# Patient Record
Sex: Male | Born: 1985 | Race: White | Hispanic: No | Marital: Married | Smoking: Current every day smoker
Health system: Southern US, Community
[De-identification: ages and names within clinical notes are randomized; demographics above are authoritative.]

## PROBLEM LIST (undated history)

## (undated) DIAGNOSIS — J45909 Unspecified asthma, uncomplicated: Secondary | ICD-10-CM

## (undated) DIAGNOSIS — J449 Chronic obstructive pulmonary disease, unspecified: Secondary | ICD-10-CM

---
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
## 2005-03-16 ENCOUNTER — Other Ambulatory Visit: Payer: Self-pay

## 2005-03-16 ENCOUNTER — Emergency Department: Payer: Self-pay | Admitting: Emergency Medicine

## 2005-11-07 ENCOUNTER — Emergency Department: Payer: Self-pay | Admitting: Emergency Medicine

## 2005-11-07 ENCOUNTER — Other Ambulatory Visit: Payer: Self-pay

## 2006-01-18 ENCOUNTER — Emergency Department: Payer: Self-pay | Admitting: Emergency Medicine

## 2006-03-05 ENCOUNTER — Emergency Department: Payer: Self-pay | Admitting: Emergency Medicine

## 2006-04-10 ENCOUNTER — Other Ambulatory Visit: Payer: Self-pay

## 2006-04-11 ENCOUNTER — Inpatient Hospital Stay: Payer: Self-pay | Admitting: Internal Medicine

## 2006-07-28 ENCOUNTER — Emergency Department: Payer: Self-pay | Admitting: Emergency Medicine

## 2006-09-09 ENCOUNTER — Emergency Department: Payer: Self-pay | Admitting: Emergency Medicine

## 2006-09-13 ENCOUNTER — Emergency Department: Payer: Self-pay | Admitting: Emergency Medicine

## 2006-09-30 ENCOUNTER — Emergency Department: Payer: Self-pay | Admitting: Emergency Medicine

## 2007-01-19 ENCOUNTER — Emergency Department: Payer: Self-pay | Admitting: Emergency Medicine

## 2007-01-19 ENCOUNTER — Other Ambulatory Visit: Payer: Self-pay

## 2007-02-08 ENCOUNTER — Emergency Department: Payer: Self-pay | Admitting: Internal Medicine

## 2007-05-23 ENCOUNTER — Emergency Department: Payer: Self-pay | Admitting: Emergency Medicine

## 2007-05-23 ENCOUNTER — Other Ambulatory Visit: Payer: Self-pay

## 2007-07-13 ENCOUNTER — Emergency Department: Payer: Self-pay | Admitting: Emergency Medicine

## 2007-08-13 ENCOUNTER — Emergency Department: Payer: Self-pay | Admitting: Emergency Medicine

## 2007-12-13 ENCOUNTER — Emergency Department: Payer: Self-pay | Admitting: Emergency Medicine

## 2008-06-04 ENCOUNTER — Emergency Department: Payer: Self-pay | Admitting: Emergency Medicine

## 2008-06-06 ENCOUNTER — Emergency Department: Payer: Self-pay | Admitting: Emergency Medicine

## 2008-06-24 ENCOUNTER — Emergency Department: Payer: Self-pay | Admitting: Emergency Medicine

## 2008-07-05 ENCOUNTER — Inpatient Hospital Stay: Payer: Self-pay | Admitting: Internal Medicine

## 2008-07-19 ENCOUNTER — Emergency Department: Payer: Self-pay | Admitting: Emergency Medicine

## 2008-10-26 ENCOUNTER — Emergency Department: Payer: Self-pay | Admitting: Emergency Medicine

## 2009-04-28 ENCOUNTER — Emergency Department: Payer: Self-pay | Admitting: Internal Medicine

## 2009-05-29 ENCOUNTER — Emergency Department: Payer: Self-pay | Admitting: Emergency Medicine

## 2010-02-03 ENCOUNTER — Inpatient Hospital Stay: Payer: Self-pay | Admitting: Psychiatry

## 2010-03-09 ENCOUNTER — Emergency Department: Payer: Self-pay | Admitting: Emergency Medicine

## 2010-11-13 ENCOUNTER — Emergency Department: Payer: Self-pay | Admitting: Emergency Medicine

## 2012-11-09 ENCOUNTER — Emergency Department: Payer: Self-pay | Admitting: Emergency Medicine

## 2013-02-13 LAB — COMPREHENSIVE METABOLIC PANEL
Alkaline Phosphatase: 78 U/L (ref 50–136)
Anion Gap: 7 (ref 7–16)
BUN: 7 mg/dL (ref 7–18)
Bilirubin,Total: 0.3 mg/dL (ref 0.2–1.0)
Calcium, Total: 9 mg/dL (ref 8.5–10.1)
Chloride: 106 mmol/L (ref 98–107)
Creatinine: 0.97 mg/dL (ref 0.60–1.30)
EGFR (Non-African Amer.): >60
Glucose: 124 mg/dL — ABNORMAL HIGH (ref 65–99)
Osmolality: 279 (ref 275–301)
Potassium: 3.2 mmol/L — ABNORMAL LOW (ref 3.5–5.1)
SGOT(AST): 83 U/L — ABNORMAL HIGH (ref 15–37)
SGPT (ALT): 73 U/L (ref 12–78)

## 2013-02-13 LAB — CBC
HCT: 46 % (ref 40.0–52.0)
MCHC: 34.2 g/dL (ref 32.0–36.0)
MCV: 99 fL (ref 80–100)
Platelet: 356 10*3/uL (ref 150–440)
RDW: 12 % (ref 11.5–14.5)

## 2013-02-13 LAB — ETHANOL: Ethanol %: 0.141 % — ABNORMAL HIGH (ref 0.000–0.080)

## 2013-02-13 LAB — SALICYLATE LEVEL: Salicylates, Serum: 2.7 mg/dL

## 2013-02-14 ENCOUNTER — Inpatient Hospital Stay: Payer: Self-pay | Admitting: Surgery

## 2013-02-14 LAB — URINALYSIS, COMPLETE
Bacteria: NONE SEEN
Bilirubin,UR: NEGATIVE
Blood: NEGATIVE
Glucose,UR: NEGATIVE mg/dL (ref 0–75)
Leukocyte Esterase: NEGATIVE
RBC,UR: <1 /HPF (ref 0–5)
Specific Gravity: 1.027 (ref 1.003–1.030)
WBC UR: 1 /HPF (ref 0–5)

## 2013-02-14 LAB — DRUG SCREEN, URINE
Benzodiazepine, Ur Scrn: NEGATIVE (ref ?–200)
MDMA (Ecstasy)Ur Screen: NEGATIVE (ref ?–500)
Opiate, Ur Screen: NEGATIVE (ref ?–300)
Phencyclidine (PCP) Ur S: NEGATIVE (ref ?–25)
Tricyclic, Ur Screen: NEGATIVE (ref ?–1000)

## 2013-03-02 ENCOUNTER — Ambulatory Visit: Payer: Self-pay | Admitting: Surgery

## 2014-02-22 ENCOUNTER — Emergency Department: Payer: Self-pay | Admitting: Emergency Medicine

## 2014-02-22 LAB — TSH: Thyroid Stimulating Horm: 1.47 u[IU]/mL

## 2014-02-22 LAB — CBC WITH DIFFERENTIAL/PLATELET
Basophil #: 0 10*3/uL (ref 0.0–0.1)
Basophil %: 0.3 %
EOS PCT: 1 %
Eosinophil #: 0.1 10*3/uL (ref 0.0–0.7)
HCT: 48.2 % (ref 40.0–52.0)
HGB: 16.2 g/dL (ref 13.0–18.0)
Lymphocyte #: 2.5 10*3/uL (ref 1.0–3.6)
Lymphocyte %: 28.2 %
MCH: 34.4 pg — AB (ref 26.0–34.0)
MCHC: 33.6 g/dL (ref 32.0–36.0)
MCV: 102 fL — AB (ref 80–100)
MONO ABS: 0.5 x10 3/mm (ref 0.2–1.0)
MONOS PCT: 6 %
Neutrophil #: 5.6 10*3/uL (ref 1.4–6.5)
Neutrophil %: 64.5 %
Platelet: 265 10*3/uL (ref 150–440)
RBC: 4.72 10*6/uL (ref 4.40–5.90)
RDW: 12.7 % (ref 11.5–14.5)
WBC: 8.7 10*3/uL (ref 3.8–10.6)

## 2014-02-22 LAB — COMPREHENSIVE METABOLIC PANEL
ALBUMIN: 3.9 g/dL (ref 3.4–5.0)
Alkaline Phosphatase: 63 U/L
Anion Gap: 9 (ref 7–16)
BILIRUBIN TOTAL: 0.7 mg/dL (ref 0.2–1.0)
BUN: 8 mg/dL (ref 7–18)
CHLORIDE: 105 mmol/L (ref 98–107)
CO2: 27 mmol/L (ref 21–32)
Calcium, Total: 8.7 mg/dL (ref 8.5–10.1)
Creatinine: 0.9 mg/dL (ref 0.60–1.30)
EGFR (African American): >60
EGFR (Non-African Amer.): >60
GLUCOSE: 91 mg/dL (ref 65–99)
Osmolality: 279 (ref 275–301)
Potassium: 3.2 mmol/L — ABNORMAL LOW (ref 3.5–5.1)
SGOT(AST): 15 U/L (ref 15–37)
SGPT (ALT): 22 U/L (ref 12–78)
SODIUM: 141 mmol/L (ref 136–145)
Total Protein: 7 g/dL (ref 6.4–8.2)

## 2014-02-22 LAB — URINALYSIS, COMPLETE
BACTERIA: NONE SEEN
BLOOD: NEGATIVE
Bilirubin,UR: NEGATIVE
Glucose,UR: NEGATIVE mg/dL (ref 0–75)
KETONE: NEGATIVE
Leukocyte Esterase: NEGATIVE
Nitrite: NEGATIVE
Ph: 5 (ref 4.5–8.0)
Protein: NEGATIVE
RBC,UR: NONE SEEN /HPF (ref 0–5)
SPECIFIC GRAVITY: 1.03 (ref 1.003–1.030)
Squamous Epithelial: NONE SEEN
WBC UR: 3 /HPF (ref 0–5)
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
## 2014-12-07 NOTE — Consult Note (Signed)
Brief Consult Note: Diagnosis: multiple right rib fractures with small pneumothorax.   Patient was seen by consultant.   Consult note dictated.   Discussed with Attending MD.   Comments: Mulitple right sided rib fractures with small pneumothorax on CT scan.  Fractures are high and therefore an epidural may not be possible to achieve adequate pain control.  Would recommend oral narcotics, pulmonary toilet with ambulation and incentive spirometry.  I would be happy to follow as an outpatient.  Electronic Signatures: Jasmine Decemberaks, Sharrell Krawiec E (MD)  (Signed 01-Jul-14 19:02)  Authored: Brief Consult Note   Last Updated: 01-Jul-14 19:02 by Jasmine Decemberaks, Anicka Stuckert E (MD)
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
## 2015-03-08 ENCOUNTER — Emergency Department
Admission: EM | Admit: 2015-03-08 | Discharge: 2015-03-08 | Disposition: A | Discharge status: Home / Self Care | Payer: Self-pay | Attending: Emergency Medicine | Admitting: Emergency Medicine

## 2015-03-08 ENCOUNTER — Other Ambulatory Visit: Payer: Self-pay

## 2015-03-08 ENCOUNTER — Emergency Department: Payer: Self-pay

## 2015-03-08 DIAGNOSIS — W92XXXA Exposure to excessive heat of man-made origin, initial encounter: Secondary | ICD-10-CM | POA: Insufficient documentation

## 2015-03-08 DIAGNOSIS — Y9389 Activity, other specified: Secondary | ICD-10-CM | POA: Insufficient documentation

## 2015-03-08 DIAGNOSIS — R079 Chest pain, unspecified: Secondary | ICD-10-CM | POA: Insufficient documentation

## 2015-03-08 DIAGNOSIS — Y990 Civilian activity done for income or pay: Secondary | ICD-10-CM | POA: Insufficient documentation

## 2015-03-08 DIAGNOSIS — T675XXA Heat exhaustion, unspecified, initial encounter: Secondary | ICD-10-CM | POA: Insufficient documentation

## 2015-03-08 DIAGNOSIS — Z720 Tobacco use: Secondary | ICD-10-CM | POA: Insufficient documentation

## 2015-03-08 HISTORY — DX: Unspecified asthma, uncomplicated: J45.909

## 2015-03-08 HISTORY — DX: Chronic obstructive pulmonary disease, unspecified: J44.9

## 2015-03-08 LAB — COMPREHENSIVE METABOLIC PANEL
ALT: 32 U/L (ref 17–63)
AST: 26 U/L (ref 15–41)
Albumin: 4.6 g/dL (ref 3.5–5.0)
Alkaline Phosphatase: 64 U/L (ref 38–126)
Anion gap: 6 (ref 5–15)
BILIRUBIN TOTAL: 0.8 mg/dL (ref 0.3–1.2)
BUN: 12 mg/dL (ref 6–20)
CALCIUM: 9.2 mg/dL (ref 8.9–10.3)
CO2: 27 mmol/L (ref 22–32)
CREATININE: 0.83 mg/dL (ref 0.61–1.24)
Chloride: 107 mmol/L (ref 101–111)
GFR calc Af Amer: >60 mL/min (ref >60)
GFR calc non Af Amer: >60 mL/min (ref >60)
GLUCOSE: 84 mg/dL (ref 65–99)
Potassium: 3.6 mmol/L (ref 3.5–5.1)
SODIUM: 140 mmol/L (ref 135–145)
Total Protein: 7.4 g/dL (ref 6.5–8.1)

## 2015-03-08 LAB — CBC
HCT: 46.7 % (ref 40.0–52.0)
Hemoglobin: 16.3 g/dL (ref 13.0–18.0)
MCH: 33.5 pg (ref 26.0–34.0)
MCHC: 34.8 g/dL (ref 32.0–36.0)
MCV: 96.1 fL (ref 80.0–100.0)
Platelets: 330 10*3/uL (ref 150–440)
RBC: 4.86 MIL/uL (ref 4.40–5.90)
RDW: 11.6 % (ref 11.5–14.5)
WBC: 8.8 10*3/uL (ref 3.8–10.6)

## 2015-03-08 LAB — TROPONIN I

## 2015-03-08 NOTE — Discharge Instructions (Signed)

## 2015-03-08 NOTE — ED Provider Notes (Signed)
[facility] Emergency Department Provider Note  ____________________________________________  Time seen: Approximately  PM  I have reviewed the triage vital signs and the nursing notes.   HISTORY  Chief Complaint Dizziness    HPI Eric [name] is a 29 y.o. male with a history of COPD who presents today with feeling faint at work today. 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 He said that he got sweaty and nauseous and vomited several times at work. He then was able to drink water and Gatorade and is feeling better at this time. He has since been able to handle it without feeling diaphoretic or like he is going to pass out. He is also complaining of chest pain that is central left sided and has been going on for months. He says however that it has worsened over the past 3 days. He says it has been constant over the past 3 days without any exacerbating factors.   Past Medical History  Diagnosis Date  . Asthma   . COPD (chronic obstructive pulmonary disease)     There are no active problems to display for this patient.   History reviewed. No pertinent past surgical history.  No current outpatient prescriptions on file.  Allergies Review of patient's allergies indicates no known allergies.  History reviewed. No pertinent family history.  Social History History  Substance Use Topics  . Smoking status: Current Every Day Smoker -- 0.50 packs/day    Types: Cigarettes  . Smokeless tobacco: Never Used  . Alcohol Use: Yes    Review of Systems Constitutional: No fever/chills Eyes: No visual changes. ENT: No sore throat. Cardiovascular: As above Respiratory: Denies shortness of breath. Gastrointestinal: No abdominal pain.  No nausea, no vomiting.  No diarrhea.  No constipation. Genitourinary: Negative for dysuria. Musculoskeletal: Negative for back pain. Skin: Negative for rash. Neurological: Negative for headaches, focal weakness or  numbness.  10-point ROS otherwise negative.  ____________________________________________   PHYSICAL EXAM:  VITAL SIGNS: ED Triage Vitals  Enc Vitals Group     BP 03/08/15 1818 129/83 mmHg     Pulse Rate 03/08/15 1818 77     Resp 03/08/15 1818 16     Temp 03/08/15 1818 98.2 F (36.8 C)     Temp Source 03/08/15 1818 Oral     SpO2 03/08/15 1818 98 %     Weight 03/08/15 1818 160 lb (72.576 kg)     Height 03/08/15 1818 5\' 6"  (1.676 m)     Head Cir --      Peak Flow --      Pain Score 03/08/15 1823 6     Pain Loc --      Pain Edu? --      Excl. in GC? --     Constitutional: Alert and oriented. Well appearing and in no acute distress. Eyes: Conjunctivae are normal. PERRL. EOMI. Head: Atraumatic. Nose: No congestion/rhinnorhea. Mouth/Throat: Mucous membranes are moist.  Oropharynx non-erythematous. Neck: No stridor.   Cardiovascular: Normal rate, regular rhythm. Grossly normal heart sounds.  Good peripheral circulation. Pain is reproducible palpation to the left chest. Respiratory: Normal respiratory effort.  No retractions. Lungs CTAB. Gastrointestinal: Soft and nontender. No distention. No abdominal bruits. No CVA tenderness. Musculoskeletal: No lower extremity tenderness nor edema.  No joint effusions. Neurologic:  Normal speech and language. No gross focal neurologic deficits are appreciated. No gait instability. Skin:  Skin is warm, dry and intact. No rash noted. Psychiatric: Mood and  affect are normal. Speech and behavior are normal.  ____________________________________________   LABS (all labs ordered are listed, but only abnormal results are displayed)  Labs Reviewed  COMPREHENSIVE METABOLIC PANEL  CBC  TROPONIN I   ____________________________________________  EKG  ED ECG REPORT I, Gibson Lad,  Teena Irani, the attending physician, personally viewed and interpreted this ECG.   Date: 03/08/2015  EKG Time: 1814  Rate: 80  Rhythm: normal sinus rhythm  Axis:  Normal axis  Intervals:none  ST&T Change: No ST elevation or depressions. No abnormal T-wave inversions.  ____________________________________________  RADIOLOGY  Borderline cardiomegaly. I personally reviewed these films. ____________________________________________   PROCEDURES    ____________________________________________   INITIAL IMPRESSION / ASSESSMENT AND PLAN / ED COURSE  Pertinent labs & imaging results that were available during my care of the patient were reviewed by me and considered in my medical decision making (see chart for details).  ----------------------------------------- 11:29 PM on 03/08/2015 -----------------------------------------  Discussed the x-ray results with the patient and the need to follow-up at a primary care doctor for likely echocardiogram. Patient does not do any exertional activities or engage in any vigorous athletic activities. Advised against any thing like this until he is cleared by his primary care doctor. Also counseled to stay hydrated with drinking plenty of water. Patient understands the plan and is willing to comply. PERC negative. ____________________________________________   FINAL CLINICAL IMPRESSION(S) / ED DIAGNOSES  Acute chest pain. Initial visit. Acute heat exhaustion.    Myrna Blazer, MD 03/08/15 [phone]

## 2015-03-08 NOTE — ED Notes (Signed)
Patient comes in via private vehicle.  Patient reports that he was at work and got hot all over and felt like he was going to "pass out".

## 2015-03-08 NOTE — ED Notes (Signed)
Patient was at work. Got too hot , felt dehydrated, got pale and nauseated. Wanted to go home but work stated he would need a work note.

## 2015-03-11 ENCOUNTER — Encounter: Payer: Self-pay | Admitting: Emergency Medicine

## 2015-03-11 ENCOUNTER — Emergency Department
Admission: EM | Admit: 2015-03-11 | Discharge: 2015-03-11 | Disposition: A | Discharge status: Home / Self Care | Payer: Self-pay | Attending: Emergency Medicine | Admitting: Emergency Medicine

## 2015-03-11 DIAGNOSIS — Z720 Tobacco use: Secondary | ICD-10-CM | POA: Insufficient documentation

## 2015-03-11 DIAGNOSIS — H1131 Conjunctival hemorrhage, right eye: Secondary | ICD-10-CM | POA: Insufficient documentation

## 2015-03-11 NOTE — Discharge Instructions (Signed)
Subconjunctival Hemorrhage °A subconjunctival hemorrhage is a bright red patch covering a portion of the white of the eye. The white part of the eye is called the sclera, and it is covered by a thin membrane called the conjunctiva. This membrane is clear, except for tiny blood vessels that you can see with the naked eye. When your eye is irritated or inflamed and becomes red, it is because the vessels in the conjunctiva are swollen. °Sometimes, a blood vessel in the conjunctiva can break and bleed. When this occurs, the blood builds up between the conjunctiva and the sclera, and spreads out to create a red area. The red spot may be very small at first. It may then spread to cover a larger part of the surface of the eye, or even all of the visible white part of the eye. °In almost all cases, the blood will go away and the eye will become white again. Before completely dissolving, however, the red area may spread. It may also become brownish-yellow in color before going away. If a lot of blood collects under the conjunctiva, it may look like a bulge on the surface of the eye. This looks scary, but it will also eventually flatten out and go away. Subconjunctival hemorrhages do not cause pain, but if swollen, may cause a feeling of irritation. There is no effect on vision.  °CAUSES  °· The most common cause is mild trauma (rubbing the eye, irritation). °· Subconjunctival hemorrhages can happen because of coughing or straining (lifting heavy objects), vomiting, or sneezing. °· In some cases, your doctor may want to check your blood pressure. High blood pressure can also cause a subconjunctival hemorrhage. °· Severe trauma or blunt injuries. °· Diseases that affect blood clotting (hemophilia, leukemia). °· Abnormalities of blood vessels behind the eye (carotid cavernous sinus fistula). °· Tumors behind the eye. °· Certain drugs (aspirin, Coumadin, heparin). °· Recent eye surgery. °HOME CARE INSTRUCTIONS  °· Do not worry  about the appearance of your eye. You may continue your usual activities. °· Often, follow-up is not necessary. °SEEK MEDICAL CARE IF:  °· Your eye becomes painful. °· The bleeding does not disappear within 3 weeks. °· Bleeding occurs elsewhere, for example, under the skin, in the mouth, or in the other eye. °· You have recurring subconjunctival hemorrhages. °SEEK IMMEDIATE MEDICAL CARE IF:  °· Your vision changes or you have difficulty seeing. °· You develop a severe headache, persistent vomiting, confusion, or abnormal drowsiness (lethargy). °· Your eye seems to bulge or protrude from the eye socket. °· You notice the sudden appearance of bruises or have spontaneous bleeding elsewhere on your body. °Document Released: 08/03/2005 Document Revised: 12/18/2013 Document Reviewed: 07/01/2009 °Exit[employer]® Patient Information ©2015 [employer]. This information is not intended to replace advice given to you by your health care provider. Make sure you discuss any questions you have with your health care provider. ° °

## 2015-03-11 NOTE — ED Notes (Signed)
Pt states he woke up this am and noticed redness to right eye.  No vision change, no pain.

## 2015-03-11 NOTE — ED Provider Notes (Signed)
[facility] Emergency Department Provider Note  ____________________________________________  Time seen: Approximately 2:32 PM  I have reviewed the triage vital signs and the nursing notes.   HISTORY  Chief Complaint Eye Problem    HPI Eric [name] is a 29 y.o. male patient awakened this morning nose or redness to the lateral aspect of the right. Patient denies any vision loss. He stated there is no pain with this complaint. She denies any history of trauma although he stated he has a forceful coughing spell 2 days ago. Denies any URI symptoms  at this time.Patient stated no palliative measures taken for this complaint.   Past Medical History  Diagnosis Date  . Asthma   . COPD (chronic obstructive pulmonary disease)     There are no active problems to display for this patient.   History reviewed. No pertinent past surgical history.  No current outpatient prescriptions on file.  Allergies Review of patient's allergies indicates no known allergies.  History reviewed. No pertinent family history.  Social History History  Substance Use Topics  . Smoking status: Current Every Day Smoker -- 0.50 packs/day    Types: Cigarettes  . Smokeless tobacco: Never Used  . Alcohol Use: Yes    Review of Systems Constitutional: No fever/chills Eyes: No visual changes. ENT: No sore throat. Red spot lateral aspect of right eye. Cardiovascular: Denies chest pain. Respiratory: Denies shortness of breath. Gastrointestinal: No abdominal pain.  No nausea, no vomiting.  No diarrhea.  No constipation. Genitourinary: Negative for dysuria. Musculoskeletal: Negative for back pain. Skin: Negative for rash. Neurological: Negative for headaches, focal weakness or numbness. A10-point ROS otherwise negative.  ____________________________________________   PHYSICAL EXAM:  VITAL SIGNS: ED Triage Vitals  Enc Vitals Group     BP 03/11/15 1356 135/90 mmHg     Pulse  Rate 03/11/15 1356 83     Resp 03/11/15 1356 18     Temp 03/11/15 1356 98.4 F (36.9 C)     Temp Source 03/11/15 1356 Oral     SpO2 03/11/15 1356 100 %     Weight 03/11/15 1356 160 lb (72.576 kg)     Height 03/11/15 1356 5\' 6"  (1.676 m)     Head Cir --      Peak Flow --      Pain Score 03/11/15 1357 0     Pain Loc --      Pain Edu? --      Excl. in GC? --    Constitutional: Alert and oriented. Well appearing and in no acute distress. Eyes: Conjunctivae are normal. PERRL. EOMI. small area of soft conjunctiva hemorrhaging  lateral aspect right. Head: Atraumatic. Nose: No congestion/rhinnorhea. Mouth/Throat: Mucous membranes are moist.  Oropharynx non-erythematous. Neck: No stridor. No cervical spine tenderness to palpation. Hematological/Lymphatic/Immunilogical: No cervical lymphadenopathy. Cardiovascular: Normal rate, regular rhythm. Grossly normal heart sounds.  Good peripheral circulation. Respiratory: Normal respiratory effort.  No retractions. Lungs CTAB. Gastrointestinal: Soft and nontender. No distention. No abdominal bruits. No CVA tenderness. Musculoskeletal: No lower extremity tenderness nor edema.  No joint effusions. Neurologic:  Normal speech and language. No gross focal neurologic deficits are appreciated. No gait instability. Skin:  Skin is warm, dry and intact. No rash noted. Psychiatric: Mood and affect are normal. Speech and behavior are normal.  ____________________________________________   LABS (all labs ordered are listed, but only abnormal results are displayed)  Labs Reviewed - No data to display ____________________________________________  EKG   ____________________________________________  RADIOLOGY  ____________________________________________   PROCEDURES  Procedure(s) performed: None  Critical Care performed: No  ____________________________________________   INITIAL IMPRESSION / ASSESSMENT AND PLAN / ED COURSE  Pertinent labs &  imaging results that were available during my care of the patient were reviewed by me and considered in my medical decision making (see chart for details).  Social conjunctiva hemorrhaging right eye. Patient reassurance given. Patient advised to follow with his family doctor or return by ER if condition worsens. 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 ____________________________________________   FINAL CLINICAL IMPRESSION(S) / ED DIAGNOSES  Final diagnoses:  Subconjunctival bleed, right      Joni Reining, PA-C 03/11/15 1438  Jene Every, MD 03/11/15 1530

## 2015-08-19 ENCOUNTER — Emergency Department
Admission: EM | Admit: 2015-08-19 | Discharge: 2015-08-19 | Disposition: A | Discharge status: Home / Self Care | Payer: Self-pay | Attending: Emergency Medicine | Admitting: Emergency Medicine

## 2015-08-19 ENCOUNTER — Encounter: Payer: Self-pay | Admitting: Emergency Medicine

## 2015-08-19 DIAGNOSIS — K047 Periapical abscess without sinus: Secondary | ICD-10-CM | POA: Insufficient documentation

## 2015-08-19 DIAGNOSIS — K032 Erosion of teeth: Secondary | ICD-10-CM | POA: Insufficient documentation

## 2015-08-19 DIAGNOSIS — F17210 Nicotine dependence, cigarettes, uncomplicated: Secondary | ICD-10-CM | POA: Insufficient documentation

## 2015-08-19 MED ORDER — MAGIC MOUTHWASH W/LIDOCAINE: 5.0000 mL | Freq: Four times a day (QID) | ORAL | Status: DC

## 2015-08-19 MED ORDER — AMOXICILLIN 875 MG PO TABS: 875.0000 mg | ORAL_TABLET | Freq: Two times a day (BID) | ORAL | Status: DC

## 2015-08-19 NOTE — ED Provider Notes (Signed)
[facility] Emergency Department Provider Note ?  ? ____________________________________________ ? Time seen: 10:01 PM ? I have reviewed the triage vital signs and the nursing notes.  ________ HISTORY ? Chief Complaint Dental Pain     HPI  Eric [name] is a 30 y.o. male   endorses left-sided lower jaw dental pain. He states he has known poor dentition but does not have a dentist. He states that symptoms began 2 days prior and had increased over the intervening period. He denies any fevers or chills, difficulty breathing or swallowing. ? ? ? Past Medical History  Diagnosis Date  . Asthma   . COPD (chronic obstructive pulmonary disease) (HCC)     There are no active problems to display for this patient.  ? History reviewed. No pertinent past surgical history. ? Current Outpatient Rx  Name  Route  Sig  Dispense  Refill  . amoxicillin (AMOXIL) 875 MG tablet   Oral   Take 1 tablet (875 mg total) by mouth 2 (two) times daily.   14 tablet   0   . magic mouthwash w/lidocaine SOLN   Oral   Take 5 mLs by mouth 4 (four) times daily.   240 mL   0     Dispense in a 1/1/1/1 ratio. Use lidocaine, diphen ...    ? Allergies Review of patient's allergies indicates no known allergies. ? No family history on file. ? Social History Social History  Substance Use Topics  . Smoking status: Current Every Day Smoker -- 0.50 packs/day    Types: Cigarettes  . Smokeless tobacco: Never Used  . Alcohol Use: Yes   ? Review of Systems Constitutional: no fever. Eyes: no discharge ENT: no sore throat. Endorses left lower dental pain. Cardiovascular: no chest pain. Respiratory: no cough. No sob Gastrointestinal: denies abdominal pain, vomiting, diarrhea, and constipation Genitourinary: no dysuria. Negative for hematuria Musculoskeletal: Negative for back pain. Skin: Negative for rash. Neurological: Negative for headaches  10-point ROS otherwise  negative.  _______________ PHYSICAL EXAM: ? VITAL SIGNS:   ED Triage Vitals  Enc Vitals Group     BP 08/19/15 2019 135/78 mmHg     Pulse Rate 08/19/15 2019 82     Resp 08/19/15 2019 18     Temp 08/19/15 2019 98.5 F (36.9 C)     Temp Source 08/19/15 2019 Oral     SpO2 08/19/15 2019 96 %     Weight 08/19/15 2019 170 lb (77.111 kg)     Height 08/19/15 2019 5\' 5"  (1.651 m)     Head Cir --      Peak Flow --      Pain Score 08/19/15 2040 10     Pain Loc --      Pain Edu? --      Excl. in GC? --    ?  Constitutional: Alert and oriented. Well appearing and in no distress. Eyes: Conjunctivae are normal.  ENT      Head: Normocephalic and atraumatic.      Ears:       Nose: No congestion/rhinnorhea.      Mouth/Throat: Mucous membranes are moist.  poor dentition throughout mouth. Significant erosion to second molar left lower dentition. Surrounding erythema and edema. Hematological/Lymphatic/Immunilogical: No cervical lymphadenopathy. Cardiovascular: Normal rate, regular rhythm. Normal S1 and S2. Respiratory: Normal respiratory effort without tachypnea nor retractions. Lungs CTAB. Gastrointestinal: Soft and nontender. No distention. There is no CVA tenderness. Genitourinary:  Musculoskeletal: Nontender with normal range  of motion in all extremities.  Neurologic:  Normal speech and language. No gross focal neurologic deficits are appreciated. Skin:  Skin is warm, dry and intact. No rash noted. Psychiatric: Mood and affect are normal. Speech and behavior are normal. Patient exhibits appropriate insight and judgment.    LABS (all labs ordered are listed, but only abnormal results are displayed)  Labs Reviewed - No data to display  ___________ RADIOLOGY    _____________ PROCEDURES ? Procedure(s) performed:    Medications - No data to display  ______________________________________________________ INITIAL IMPRESSION / ASSESSMENT AND PLAN / ED COURSE ? Pertinent labs &  imaging results that were available during my care of the patient were reviewed by me and considered in my medical decision making (see chart for details).   Patient's diagnosis is consistent with dental caries and dental abscess. Patient will be placed on antibiotics and magic mouthwash for symptom control. He may take Tylenol and ibuprofen in addition for additional symptom control. Patient will follow-up with [facility] dental clinic.     New Prescriptions   AMOXICILLIN (AMOXIL) 875 MG TABLET    Take 1 tablet (875 mg total) by mouth 2 (two) times daily.   MAGIC MOUTHWASH W/LIDOCAINE SOLN    Take 5 mLs by mouth 4 (four) times daily.   ____________________________________________ FINAL CLINICAL IMPRESSION(S) / ED DIAGNOSES?  Final diagnoses:  Dental abscess            Racheal Patches, PA-C 08/19/15 2201  Jennye Moccasin, MD 08/19/15 2328

## 2015-08-19 NOTE — Discharge Instructions (Signed)
Dental Abscess °A dental abscess is a collection of pus in or around a tooth. °CAUSES °This condition is caused by a bacterial infection around the root of the tooth that involves the inner part of the tooth (pulp). It may result from: °· Severe tooth decay. °· Trauma to the tooth that allows bacteria to enter into the pulp, such as a broken or chipped tooth. °· Severe gum disease around a tooth. °SYMPTOMS °Symptoms of this condition include: °· Severe pain in and around the infected tooth. °· Swelling and redness around the infected tooth, in the mouth, or in the face. °· Tenderness. °· Pus drainage. °· Bad breath. °· Bitter taste in the mouth. °· Difficulty swallowing. °· Difficulty opening the mouth. °· Nausea. °· Vomiting. °· Chills. °· Swollen neck glands. °· Fever. °DIAGNOSIS °This condition is diagnosed with examination of the infected tooth. During the exam, your dentist may tap on the infected tooth. Your dentist will also ask about your medical and dental history and may order X-rays. °TREATMENT °This condition is treated by eliminating the infection. This may be done with: °· Antibiotic medicine. °· A root canal. This may be performed to save the tooth. °· Pulling (extracting) the tooth. This may also involve draining the abscess. This is done if the tooth cannot be saved. °HOME CARE INSTRUCTIONS °· Take medicines only as directed by your dentist. °· If you were prescribed antibiotic medicine, finish all of it even if you start to feel better. °· Rinse your mouth (gargle) often with salt water to relieve pain or swelling. °· Do not drive or operate heavy machinery while taking pain medicine. °· Do not apply heat to the outside of your mouth. °· Keep all follow-up visits as directed by your dentist. This is important. °SEEK MEDICAL CARE IF: °· Your pain is worse and is not helped by medicine. °SEEK IMMEDIATE MEDICAL CARE IF: °· You have a fever or chills. °· Your symptoms suddenly get worse. °· You have a  very bad headache. °· You have problems breathing or swallowing. °· You have trouble opening your mouth. °· You have swelling in your neck or around your eye. °  °This information is not intended to replace advice given to you by your health care provider. Make sure you discuss any questions you have with your health care provider. °  °Document Released: 08/03/2005 Document Revised: 12/18/2014 Document Reviewed: 07/31/2014 °Elsevier Interactive Patient Education ©2016 [employer] ° °Dental Care and Dentist Visits °Dental care supports good overall health. Regular dental visits can also help you avoid dental pain, bleeding, infection, and other more serious health problems in the future. It is important to keep the mouth healthy because diseases in the teeth, gums, and other oral tissues can spread to other areas of the body. Some problems, such as diabetes, heart disease, and pre-term labor have been associated with poor oral health.  °See your dentist every 6 months. If you experience emergency problems such as a toothache or broken tooth, go to the dentist right away. If you see your dentist regularly, you may catch problems early. It is easier to be treated for problems in the early stages.  °WHAT TO EXPECT AT A DENTIST VISIT  °Your dentist will look for many common oral health problems and recommend proper treatment. At your regular dental visit, you can expect: °· Gentle cleaning of the teeth and gums. This includes scraping and polishing. This helps to remove the sticky substance around the teeth and gums (  plaque). Plaque forms in the mouth shortly after eating. Over time, plaque hardens on the teeth as tartar. If tartar is not removed regularly, it can cause problems. Cleaning also helps remove stains. °· Periodic X-rays. These pictures of the teeth and supporting bone will help your dentist assess the health of your teeth. °· Periodic fluoride treatments. Fluoride is a natural mineral shown to help  strengthen teeth. Fluoride treatment involves applying a fluoride gel or varnish to the teeth. It is most commonly done in children. °· Examination of the mouth, tongue, jaws, teeth, and gums to look for any oral health problems, such as: °¨ Cavities (dental caries). This is decay on the tooth caused by plaque, sugar, and acid in the mouth. It is [place] to catch a cavity when it is small. °¨ Inflammation of the gums caused by plaque buildup (gingivitis). °¨ Problems with the mouth or malformed or misaligned teeth. °¨ Oral cancer or other diseases of the soft tissues or jaws.  °KEEP YOUR TEETH AND GUMS HEALTHY °For healthy teeth and gums, follow these general guidelines as well as your dentist's specific advice: °· Have your teeth professionally cleaned at the dentist every 6 months. °· Brush twice daily with a fluoride toothpaste. °· Floss your teeth daily.  °· Ask your dentist if you need fluoride supplements, treatments, or fluoride toothpaste. °· Eat a healthy diet. Reduce foods and drinks with added sugar. °· Avoid smoking. °TREATMENT FOR ORAL HEALTH PROBLEMS °If you have oral health problems, treatment varies depending on the conditions present in your teeth and gums. °· Your caregiver will most likely recommend good oral hygiene at each visit. °· For cavities, gingivitis, or other oral health disease, your caregiver will perform a procedure to treat the problem. This is typically done at a separate appointment. Sometimes your caregiver will refer you to another dental specialist for specific tooth problems or for surgery. °SEEK IMMEDIATE DENTAL CARE IF: °· You have pain, bleeding, or soreness in the gum, tooth, jaw, or mouth area. °· A permanent tooth becomes loose or separated from the gum socket. °· You experience a blow or injury to the mouth or jaw area. °  °This information is not intended to replace advice given to you by your health care provider. Make sure you discuss any questions you have with your  health care provider. °  °Document Released: 04/15/2011 Document Revised: 10/26/2011 Document Reviewed: 04/15/2011 °Elsevier Interactive Patient Education ©2016 Elsevier Inc. ° °

## 2015-08-19 NOTE — ED Notes (Signed)
AAOx3.  Skin warm and dry.  NAD 

## 2015-08-19 NOTE — ED Notes (Signed)
Pt presents to ED with tooth pain since last night. Located on the bottom left side. Pt reports a throbbing pain. Reports similar pain previously when he had to get a tooth pulled.

## 2015-10-04 DIAGNOSIS — H6093 Unspecified otitis externa, bilateral: Secondary | ICD-10-CM | POA: Insufficient documentation

## 2015-10-04 DIAGNOSIS — Z79899 Other long term (current) drug therapy: Secondary | ICD-10-CM | POA: Insufficient documentation

## 2015-10-04 DIAGNOSIS — F17210 Nicotine dependence, cigarettes, uncomplicated: Secondary | ICD-10-CM | POA: Insufficient documentation

## 2015-10-04 DIAGNOSIS — Z792 Long term (current) use of antibiotics: Secondary | ICD-10-CM | POA: Insufficient documentation

## 2015-10-04 NOTE — ED Notes (Signed)
Pt states right ear pain with bleeding that began yesterday. Pt states has decreased hearing from ear. No blood noted in triage, pt denies fever. Pt appears in no acute distress.

## 2015-10-05 ENCOUNTER — Emergency Department
Admission: EM | Admit: 2015-10-05 | Discharge: 2015-10-05 | Disposition: A | Discharge status: Home / Self Care | Payer: Self-pay | Attending: Emergency Medicine | Admitting: Emergency Medicine

## 2015-10-05 DIAGNOSIS — H6093 Unspecified otitis externa, bilateral: Secondary | ICD-10-CM

## 2015-10-05 MED ORDER — AMOXICILLIN-POT CLAVULANATE 875-125 MG PO TABS: 1.0000 | ORAL_TABLET | Freq: Two times a day (BID) | ORAL | Status: AC

## 2015-10-05 MED ORDER — AMOXICILLIN-POT CLAVULANATE 875-125 MG PO TABS
1.0000 | ORAL_TABLET | Freq: Once | ORAL | Status: AC
  Administered 2015-10-05: 1 via ORAL
  Filled 2015-10-05: qty 1

## 2015-10-05 MED ORDER — CIPROFLOXACIN-DEXAMETHASONE 0.3-0.1 % OT SUSP
4.0000 [drp] | Freq: Once | OTIC | Status: AC
Start: 2015-10-05 — End: 2015-10-05
  Administered 2015-10-05: 4 [drp] via OTIC
  Filled 2015-10-05: qty 7.5

## 2015-10-05 MED ORDER — CIPROFLOXACIN-DEXAMETHASONE 0.3-0.1 % OT SUSP: 4.0000 [drp] | Freq: Two times a day (BID) | OTIC | Status: AC

## 2015-10-05 NOTE — ED Provider Notes (Signed)
[facility] Emergency Department Provider Note  ____________________________________________  Time seen: 1:05 AM  I have reviewed the triage vital signs and the nursing notes.   HISTORY  Chief Complaint Otalgia      HPI Eric [name] is a 30 y.o. male presents with bilateral ear pain and drainage times 1 day. Denies fever, no nausea or vomiting or headache. Patient states current pain score 7 out of     Past Medical History  Diagnosis Date  . Asthma   . COPD (chronic obstructive pulmonary disease) (HCC)     There are no active problems to display for this patient.   No past surgical history on file.  Current Outpatient Rx  Name  Route  Sig  Dispense  Refill  . amoxicillin (AMOXIL) 875 MG tablet   Oral   Take 1 tablet (875 mg total) by mouth 2 (two) times daily.   14 tablet   0   . amoxicillin-clavulanate (AUGMENTIN) 875-125 MG tablet   Oral   Take 1 tablet by mouth 2 (two) times daily.   20 tablet   0   . ciprofloxacin-dexamethasone (CIPRODEX) otic suspension   Both Ears   Place 4 drops into both ears 2 (two) times daily.   7.5 mL   0   . magic mouthwash w/lidocaine SOLN   Oral   Take 5 mLs by mouth 4 (four) times daily.   240 mL   0     Dispense in a 1/1/1/1 ratio. Use lidocaine, diphen ...     Allergies No known drug allergies No family history on file.  Social History Social History  Substance Use Topics  . Smoking status: Current Every Day Smoker -- 0.50 packs/day    Types: Cigarettes  . Smokeless tobacco: Never Used  . Alcohol Use: Yes    Review of Systems  Constitutional: Negative for fever. Eyes: Negative for visual changes. ENT: Negative for sore throat. Positive for bilateral ear pain and drainage Cardiovascular: Negative for chest pain. Respiratory: Negative for shortness of breath. Gastrointestinal: Negative for abdominal pain, vomiting and diarrhea. Genitourinary: Negative for  dysuria. Musculoskeletal: Negative for back pain. Skin: Negative for rash. Neurological: Negative for headaches, focal weakness or numbness.  10-point ROS otherwise negative.  ____________________________________________   PHYSICAL EXAM:  VITAL SIGNS: ED Triage Vitals  Enc Vitals Group     BP 10/04/15 2216 135/87 mmHg     Pulse Rate 10/04/15 2216 100     Resp 10/04/15 2216 18     Temp 10/04/15 2216 98 F (36.7 C)     Temp Source 10/04/15 2216 Oral     SpO2 10/04/15 2216 100 %     Weight 10/04/15 2216 165 lb (74.844 kg)     Height 10/04/15 2216  (1.651 m)     Head Cir --      Peak Flow --      Pain Score 10/04/15 2219 7     Pain Loc --      Pain Edu? --      Excl. in GC? --      Constitutional: Alert and oriented. Well appearing and in no distress. Eyes: Conjunctivae are normal. PERRL. Normal extraocular movements. ENT   Head: Normocephalic and atraumatic.   Nose: No congestion/rhinnorhea.   Mouth/Throat: Mucous membranes are moist.   Neck: No stridor. Ears: Bilateral external auditory canal edema erythema and purulent drainage Hematological/Lymphatic/Immunilogical: No cervical lymphadenopathy. Cardiovascular: Normal rate, regular rhythm. Normal and symmetric distal pulses  are present in all extremities. No murmurs, rubs, or gallops. Respiratory: Normal respiratory effort without tachypnea nor retractions. Breath sounds are clear and equal bilaterally. No wheezes/rales/rhonchi. Gastrointestinal: Soft and nontender. No distention. There is no CVA tenderness. Genitourinary: deferred Musculoskeletal: Nontender with normal range of motion in all extremities. No joint effusions.  No lower extremity tenderness nor edema. Neurologic:  Normal speech and language. No gross focal neurologic deficits are appreciated. Speech is normal.  Skin:  Skin is warm, dry and intact. No rash noted. Psychiatric: Mood and affect are normal. Speech and behavior are normal.  Patient exhibits appropriate insight and judgment.     INITIAL IMPRESSION / ASSESSMENT AND PLAN / ED COURSE  Pertinent labs & imaging results that were available during my care of the patient were reviewed by me and considered in my medical decision making (see chart for details).  Patient received Ciprodex in the emergency department as well as Augmentin.  ____________________________________________   FINAL CLINICAL IMPRESSION(S) / ED DIAGNOSES  Final diagnoses:  Otitis externa, bilateral      Darci Current, MD 10/05/15 [phone]

## 2015-10-05 NOTE — Discharge Instructions (Signed)

## 2016-04-23 ENCOUNTER — Emergency Department
Admission: EM | Admit: 2016-04-23 | Discharge: 2016-04-23 | Disposition: A | Discharge status: Home / Self Care | Payer: Self-pay | Attending: Emergency Medicine | Admitting: Emergency Medicine

## 2016-04-23 ENCOUNTER — Emergency Department: Payer: Self-pay

## 2016-04-23 DIAGNOSIS — F17210 Nicotine dependence, cigarettes, uncomplicated: Secondary | ICD-10-CM | POA: Insufficient documentation

## 2016-04-23 DIAGNOSIS — J449 Chronic obstructive pulmonary disease, unspecified: Secondary | ICD-10-CM | POA: Insufficient documentation

## 2016-04-23 DIAGNOSIS — X58XXXA Exposure to other specified factors, initial encounter: Secondary | ICD-10-CM | POA: Insufficient documentation

## 2016-04-23 DIAGNOSIS — Y9369 Activity, other involving other sports and athletics played as a team or group: Secondary | ICD-10-CM | POA: Insufficient documentation

## 2016-04-23 DIAGNOSIS — J45909 Unspecified asthma, uncomplicated: Secondary | ICD-10-CM | POA: Insufficient documentation

## 2016-04-23 DIAGNOSIS — Y9289 Other specified places as the place of occurrence of the external cause: Secondary | ICD-10-CM | POA: Insufficient documentation

## 2016-04-23 DIAGNOSIS — S60052A Contusion of left little finger without damage to nail, initial encounter: Secondary | ICD-10-CM | POA: Insufficient documentation

## 2016-04-23 DIAGNOSIS — Y999 Unspecified external cause status: Secondary | ICD-10-CM | POA: Insufficient documentation

## 2016-04-23 MED ORDER — NAPROXEN 500 MG PO TABS: 500.0000 mg | ORAL_TABLET | Freq: Two times a day (BID) | ORAL | 0 refills | Status: DC

## 2016-04-23 NOTE — ED Provider Notes (Signed)
[facility] Emergency Department Provider Note  ____________________________________________  Time seen: Approximately 10:30 AM  I have reviewed the triage vital signs and the nursing notes.   HISTORY  Chief Complaint Finger Injury    HPI Eric [name] is a 30 y.o. male , NAD, presents to emergency with one-day history of left hand and fifth finger pain. States he was playing with his son last night and feels like he may have jammed the left fifth finger. Has had pain about the base of that finger since the incident. Has not applied any ice or taken anything over-the-counter for the pain. Denies any decreased range of motion, numbness, weakness, tingling. Has not noted any redness, swelling, bruising, skin sores or open wounds. 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   Past Medical History:  Diagnosis Date  . Asthma   . COPD (chronic obstructive pulmonary disease) (HCC)     There are no active problems to display for this patient.   No past surgical history on file.  Prior to Admission medications   Medication Sig Start Date End Date Taking? Authorizing Provider  amoxicillin (AMOXIL) 875 MG tablet Take 1 tablet (875 mg total) by mouth 2 (two) times daily. 08/19/15   Delorise Royals Cuthriell, PA-C  magic mouthwash w/lidocaine SOLN Take 5 mLs by mouth 4 (four) times daily. 08/19/15   Delorise Royals Cuthriell, PA-C  naproxen (NAPROSYN) 500 MG tablet Take 1 tablet (500 mg total) by mouth 2 (two) times daily with a meal. 04/23/16   Jami L Hagler, PA-C    Allergies Review of patient's allergies indicates no known allergies.  No family history on file.  Social History Social History  Substance Use Topics  . Smoking status: Current Every Day Smoker    Packs/day: 0.50    Types: Cigarettes  . Smokeless tobacco: Never Used  . Alcohol  use Yes     Review of Systems  Constitutional: No fever/chills Musculoskeletal: Positive left hand and fifth finger pain. No wrist or forearm pain..  Skin: Negative for rash him a redness, swelling, bruising, open wounds or lacerations. Neurological: Negative for numbness, weakness, tingling. 10-point ROS otherwise negative.  ____________________________________________   PHYSICAL EXAM:  VITAL SIGNS: ED Triage Vitals  Enc Vitals Group     BP 04/23/16 0934 (!) 129/92     Pulse Rate 04/23/16 0934 67     Resp 04/23/16 0934 18     Temp 04/23/16 0934 97.8 F (36.6 C)     Temp Source 04/23/16 0934 Oral     SpO2 04/23/16 0934 98 %     Weight 04/23/16 0935 165 lb (74.8 kg)     Height 04/23/16 0935 5\' 5"  (1.651 m)     Head Circumference --      Peak Flow --      Pain Score 04/23/16 0935 8     Pain Loc --      Pain Edu? --      Excl. in GC? --      Constitutional: Alert and oriented. Well appearing and in no acute distress. Eyes: Conjunctivae are normal.  Head: Atraumatic. Cardiovascular: Good peripheral circulation with 2+ pulses noted in the left upper extremity. Capillary refill is brisk in all digits of left hand. Respiratory: Normal respiratory effort without tachypnea or retractions. Musculoskeletal: Full range of  motion of the left hand and wrist as well as all the fingers without pain or difficulty. Mild tenderness to palpation about the left fifth MCP without bony abnormality or crepitus noted. Neurologic:  Normal speech and language. No gross focal neurologic deficits are appreciated. Sensation to light touch grossly intact about left upper extremity Skin:  Skin is warm, dry and intact. No rash, Redness, swelling, bruising, open wounds or lacerations noted. Psychiatric: Mood and affect are normal. Speech and behavior are normal. Patient exhibits appropriate insight and judgement.   ____________________________________________    LABS  None ____________________________________________  EKG  None ____________________________________________  RADIOLOGY I have personally viewed and evaluated these images (plain radiographs) as part of my medical decision making, as well as reviewing the written report by the radiologist.  Dg Hand Complete Left  Result Date: 04/23/2016 CLINICAL DATA:  Pt was wrestling with son last night and hurt his left hand. His pain is in the 5th finger. No previous injury. EXAM: LEFT HAND - COMPLETE 3+ VIEW COMPARISON:  None. FINDINGS: There is no evidence of fracture or dislocation. There is no evidence of arthropathy or other focal bone abnormality. Soft tissues are unremarkable. IMPRESSION: Negative. Electronically Signed   By: Amie Portlandavid  Ormond M.D.   On: 04/23/2016 10:18    ____________________________________________    PROCEDURES  Procedure(s) performed: None   Procedures   Medications - No data to display   ____________________________________________   INITIAL IMPRESSION / ASSESSMENT AND PLAN / ED COURSE  Pertinent labs & imaging results that were available during my care of the patient were reviewed by me and considered in my medical decision making (see chart for details).  Clinical Course    Patient's diagnosis is consistent with Contusion of fifth finger left hand. Patient will be discharged home with prescriptions for Naprosyn to take as directed. Patient is advised to apply ice to the affected area 20 minutes 3-4 times daily and complete range of motion exercises as discussed. Patient was given a work note to excuse from work today and may return tomorrow without restrictions. Patient [facility] if symptoms persist past this treatment course. Patient is given ED precautions to return to the ED for any worsening or new symptoms.      ____________________________________________  FINAL CLINICAL IMPRESSION(S) / ED  DIAGNOSES  Final diagnoses:  Contusion of fifth finger of left hand, initial encounter      NEW MEDICATIONS STARTED DURING THIS VISIT:  Discharge Medication List as of 04/23/2016 10:32 AM    START taking these medications   Details  naproxen (NAPROSYN) 500 MG tablet Take 1 tablet (500 mg total) by mouth 2 (two) times daily with a meal., Starting Thu 04/23/2016, Print             Ernestene KielJami L Fort Campbell NorthHagler, PA-C 04/23/16 1223    Governor Rooksebecca Lord, MD 04/23/16 1538

## 2016-04-23 NOTE — ED Notes (Signed)
Pt verbalizes understanding of d/c instructions, medication and follow up 

## 2016-04-23 NOTE — ED Triage Notes (Signed)
Pt arrives to triage with reports of left hand/ fifth digit pain  He was playing with his son last night when the injury occurred

## 2017-04-09 ENCOUNTER — Encounter: Payer: Self-pay | Admitting: Emergency Medicine

## 2017-04-09 ENCOUNTER — Emergency Department: Payer: Self-pay

## 2017-04-09 ENCOUNTER — Emergency Department
Admission: EM | Admit: 2017-04-09 | Discharge: 2017-04-09 | Disposition: A | Discharge status: Home / Self Care | Payer: Self-pay | Attending: Emergency Medicine | Admitting: Emergency Medicine

## 2017-04-09 DIAGNOSIS — Z23 Encounter for immunization: Secondary | ICD-10-CM | POA: Insufficient documentation

## 2017-04-09 DIAGNOSIS — S62524A Nondisplaced fracture of distal phalanx of right thumb, initial encounter for closed fracture: Secondary | ICD-10-CM | POA: Insufficient documentation

## 2017-04-09 DIAGNOSIS — S62639B Displaced fracture of distal phalanx of unspecified finger, initial encounter for open fracture: Secondary | ICD-10-CM

## 2017-04-09 DIAGNOSIS — Y939 Activity, unspecified: Secondary | ICD-10-CM | POA: Insufficient documentation

## 2017-04-09 DIAGNOSIS — Y929 Unspecified place or not applicable: Secondary | ICD-10-CM | POA: Insufficient documentation

## 2017-04-09 DIAGNOSIS — Y990 Civilian activity done for income or pay: Secondary | ICD-10-CM | POA: Insufficient documentation

## 2017-04-09 DIAGNOSIS — S61011A Laceration without foreign body of right thumb without damage to nail, initial encounter: Secondary | ICD-10-CM | POA: Insufficient documentation

## 2017-04-09 DIAGNOSIS — W312XXA Contact with powered woodworking and forming machines, initial encounter: Secondary | ICD-10-CM | POA: Insufficient documentation

## 2017-04-09 DIAGNOSIS — J449 Chronic obstructive pulmonary disease, unspecified: Secondary | ICD-10-CM | POA: Insufficient documentation

## 2017-04-09 DIAGNOSIS — F17210 Nicotine dependence, cigarettes, uncomplicated: Secondary | ICD-10-CM | POA: Insufficient documentation

## 2017-04-09 DIAGNOSIS — J45909 Unspecified asthma, uncomplicated: Secondary | ICD-10-CM | POA: Insufficient documentation

## 2017-04-09 MED ORDER — CEPHALEXIN 500 MG PO CAPS
500.0000 mg | ORAL_CAPSULE | Freq: Once | ORAL | Status: AC
  Administered 2017-04-09: 500 mg via ORAL
  Filled 2017-04-09: qty 1

## 2017-04-09 MED ORDER — IBUPROFEN 800 MG PO TABS: 800.0000 mg | ORAL_TABLET | Freq: Three times a day (TID) | ORAL | 0 refills | Status: DC | PRN

## 2017-04-09 MED ORDER — LIDOCAINE HCL (PF) 1 % IJ SOLN
INTRAMUSCULAR | Status: AC
  Filled 2017-04-09: qty 5

## 2017-04-09 MED ORDER — TETANUS-DIPHTH-ACELL PERTUSSIS 5-2.5-18.5 LF-MCG/0.5 IM SUSP: 0.5000 mL | Freq: Once | INTRAMUSCULAR | Status: DC

## 2017-04-09 MED ORDER — OXYCODONE-ACETAMINOPHEN 5-325 MG PO TABS
1.0000 | ORAL_TABLET | Freq: Once | ORAL | Status: AC
  Administered 2017-04-09: 1 via ORAL
  Filled 2017-04-09: qty 1

## 2017-04-09 MED ORDER — CEPHALEXIN 500 MG PO CAPS: 500.0000 mg | ORAL_CAPSULE | Freq: Three times a day (TID) | ORAL | 0 refills | Status: DC

## 2017-04-09 MED ORDER — OXYCODONE-ACETAMINOPHEN 5-325 MG PO TABS: 1.0000 | ORAL_TABLET | ORAL | 0 refills | Status: DC | PRN

## 2017-04-09 NOTE — ED Triage Notes (Signed)
Patient to [facility] via EMS for laceration to right thumb.  Reports working with a grinder and it went across the pad of the thumb.  Bleeding controlled with dressing.  By EMS:  Hr - 100; bp - 160 95, pulse ox 96% on room air.

## 2017-04-09 NOTE — ED Provider Notes (Signed)
[facility] Emergency Department Provider Note   ____________________________________________   First MD Initiated Contact with Patient 04/09/17 0045     (approximate)  I have reviewed the triage vital signs and the nursing notes.   HISTORY  Chief Complaint Laceration    HPI Eric [name] is a 31 y.o. male brought to the ED via EMS with a chief complaint of right thumb laceration. Patient was at work, working with a grinder when it cut him across the pad of his right thumb. Patient is ambidextrous. Tetanus is up-to-date. Does not wish to file Workmen's Comp. Arrives with dressing in place with bleeding controlled. Denies pain, numbness or tingling. Voices no other medical complaints.   Past Medical History:  Diagnosis Date  . Asthma   . COPD (chronic obstructive pulmonary disease) (HCC)     There are no active problems to display for this patient.   History reviewed. No pertinent surgical history.  Prior to Admission medications   Medication Sig Start Date End Date Taking? Authorizing Provider  amoxicillin (AMOXIL) 875 MG tablet Take 1 tablet (875 mg total) by mouth 2 (two) times daily. 08/19/15   Cuthriell, Delorise Royals, PA-C  cephALEXin (KEFLEX) 500 MG capsule Take 1 capsule (500 mg total) by mouth 3 (three) times daily. 04/09/17   Irean Hong, MD  ibuprofen (ADVIL,MOTRIN) 800 MG tablet Take 1 tablet (800 mg total) by mouth every 8 (eight) hours as needed for moderate pain. 04/09/17   Irean Hong, MD  magic mouthwash w/lidocaine SOLN Take 5 mLs by mouth 4 (four) times daily. 08/19/15   Cuthriell, Delorise Royals, PA-C  naproxen (NAPROSYN) 500 MG tablet Take 1 tablet (500 mg total) by mouth 2 (two) times daily with a meal. 04/23/16   Hagler, Jami L, PA-C  oxyCODONE-acetaminophen (ROXICET) 5-325 MG tablet Take 1 tablet by mouth every 4 (four) hours as needed for severe pain. 04/09/17   Irean Hong, MD    Allergies Patient has no known allergies.  No family  history on file.  Social History Social History  Substance Use Topics  . Smoking status: Current Every Day Smoker    Packs/day: 0.50    Types: Cigarettes  . Smokeless tobacco: Never Used  . Alcohol use No    Review of Systems  Constitutional: No fever/chills. Eyes: No visual changes. ENT: No sore throat. Cardiovascular: Denies chest pain. Respiratory: Denies shortness of breath. Gastrointestinal: No abdominal pain.  No nausea, no vomiting.  No diarrhea.  No constipation. Genitourinary: Negative for dysuria. Musculoskeletal: Positive for right thumb laceration. Negative for back pain. Skin: Negative for rash. Neurological: Negative for headaches, focal weakness or numbness.   ____________________________________________   PHYSICAL EXAM:  VITAL SIGNS: ED Triage Vitals  Enc Vitals Group     BP 04/09/17 0019 (!) 140/94     Pulse Rate 04/09/17 0019 90     Resp 04/09/17 0019 20     Temp 04/09/17 0019 98.5 F (36.9 C)     Temp Source 04/09/17 0019 Oral     SpO2 04/09/17 0019 97 %     Weight 04/09/17 0016 172 lb (78 kg)     Height 04/09/17 0016 5\' 5"  (1.651 m)     Head Circumference --      Peak Flow --      Pain Score 04/09/17 0016 4     Pain Loc --      Pain Edu? --      Excl. in GC? --  Constitutional: Alert and oriented. Well appearing and in no acute distress. Eyes: Conjunctivae are normal. PERRL. EOMI. Head: Atraumatic. Nose: No congestion/rhinnorhea. Mouth/Throat: Mucous membranes are moist.  Oropharynx non-erythematous. Neck: No stridor.  No cervical spine tenderness to palpation. Cardiovascular: Normal rate, regular rhythm. Grossly normal heart sounds.  Good peripheral circulation. Respiratory: Normal respiratory effort.  No retractions. Lungs CTAB. Gastrointestinal: Soft and nontender. No distention. No abdominal bruits. No CVA tenderness. Musculoskeletal:  Right thumb: Approximately 2 cm curved laceration through lateral thumb pad which nearly avoids  fingernail. Bleeding controlled. Motor strength and sensation intact. 2+ distal pulses. Brisk, less than 5 second capillary refill. Neurologic:  Normal speech and language. No gross focal neurologic deficits are appreciated. No gait instability. Skin:  Skin is warm, dry and intact. No rash noted. Psychiatric: Mood and affect are normal. Speech and behavior are normal.  ____________________________________________   LABS (all labs ordered are listed, but only abnormal results are displayed)  Labs Reviewed - No data to display ____________________________________________  EKG  none ____________________________________________  RADIOLOGY  Dg Hand Complete Right  Result Date: 04/09/2017 CLINICAL DATA:  Right thumb laceration EXAM: RIGHT HAND - COMPLETE 3+ VIEW COMPARISON:  11/09/2012 FINDINGS: Soft tissue laceration along the distal aspect of the 1st digit. Associated distal tuft fracture. Mild soft tissue swelling. IMPRESSION: 1st distal tuft fracture with associated soft tissue swelling/laceration. Electronically Signed   By: Charline Bills M.D.   On: 04/09/2017 01:03    ____________________________________________   PROCEDURES  Procedure(s) performed:   LACERATION REPAIR Performed by: Irean Hong Authorized by: Irean Hong Consent: Verbal consent obtained. Risks and benefits: risks, benefits and alternatives were discussed Consent given by: patient Patient identity confirmed: provided demographic data Prepped and Draped in normal sterile fashion Wound explored  Laceration Location: Right thumb pad  Laceration Length: 2cm  No Foreign Bodies seen or palpated  Anesthesia: digital block  Local anesthetic: lidocaine 1% w/o epinephrine  Anesthetic total: 8 ml  Irrigation with 2L NS Amount of cleaning: standard  Skin closure: 4-0 nylon  Number of sutures: 2  Technique: Standard sterile  Patient tolerance: Patient tolerated the procedure well with no immediate  complications.  Procedures  Critical Care performed: No  ____________________________________________   INITIAL IMPRESSION / ASSESSMENT AND PLAN / ED COURSE  Pertinent labs & imaging results that were available during my care of the patient were reviewed by me and considered in my medical decision making (see chart for details).  31 year old male who presents with right thumb laceration with tuft fracture. There is no nail bed injury. After copious irrigation, thumb pad was loosely approximated; dressed with Xeroform and bulky dressing. Will place patient on antibiotics and referred to hand surgery for follow-up. Strict return precautions given. Patient verbalizes understanding and agrees with plan of care.      ____________________________________________   FINAL CLINICAL IMPRESSION(S) / ED DIAGNOSES  Final diagnoses:  Laceration of right thumb without foreign body without damage to nail, initial encounter  Open fracture of tuft of distal phalanx of finger      NEW MEDICATIONS STARTED DURING THIS VISIT:  New Prescriptions   CEPHALEXIN (KEFLEX) 500 MG CAPSULE    Take 1 capsule (500 mg total) by mouth 3 (three) times daily.   IBUPROFEN (ADVIL,MOTRIN) 800 MG TABLET    Take 1 tablet (800 mg total) by mouth every 8 (eight) hours as needed for moderate pain.   OXYCODONE-ACETAMINOPHEN (ROXICET) 5-325 MG TABLET    Take 1 tablet by mouth every 4 (four)  hours as needed for severe pain.     Note:  This document was prepared using Dragon voice recognition software and may include unintentional dictation errors.    Irean Hong, MD 04/09/17 [phone]

## 2017-04-09 NOTE — Discharge Instructions (Signed)
1. Take antibiotic as prescribed (Keflex 500 mg 3 times daily 7 days). 2. You may take pain medicines as needed (Motrin/Percocet). 3. You may remove dressing after 24 hours. Keep wound clean & dry. 4. Return to the ER for worsening symptoms, increased redness, swelling, purulent discharge, or other concerns.

## 2017-04-28 ENCOUNTER — Emergency Department
Admission: EM | Admit: 2017-04-28 | Discharge: 2017-04-28 | Disposition: A | Discharge status: Home / Self Care | Payer: Self-pay | Attending: Emergency Medicine | Admitting: Emergency Medicine

## 2017-04-28 ENCOUNTER — Encounter: Payer: Self-pay | Admitting: Emergency Medicine

## 2017-04-28 DIAGNOSIS — S61011D Laceration without foreign body of right thumb without damage to nail, subsequent encounter: Secondary | ICD-10-CM | POA: Insufficient documentation

## 2017-04-28 DIAGNOSIS — J45909 Unspecified asthma, uncomplicated: Secondary | ICD-10-CM | POA: Insufficient documentation

## 2017-04-28 DIAGNOSIS — X58XXXD Exposure to other specified factors, subsequent encounter: Secondary | ICD-10-CM | POA: Insufficient documentation

## 2017-04-28 DIAGNOSIS — Z4802 Encounter for removal of sutures: Secondary | ICD-10-CM

## 2017-04-28 DIAGNOSIS — J449 Chronic obstructive pulmonary disease, unspecified: Secondary | ICD-10-CM | POA: Insufficient documentation

## 2017-04-28 DIAGNOSIS — F17210 Nicotine dependence, cigarettes, uncomplicated: Secondary | ICD-10-CM | POA: Insufficient documentation

## 2017-04-28 NOTE — ED Triage Notes (Signed)
Patient here for suture removal of right thumb.

## 2017-04-28 NOTE — ED Provider Notes (Signed)
[facility] Emergency Department Provider Note  ____________________________________________  Time seen: Approximately 8:15 PM  I have reviewed the triage vital signs and the nursing notes.   HISTORY  Chief Complaint Wound Check    HPI Eric [name] is a 31 y.o. male who presents emergency department for removal of sutures from his right thumb. Patient suffered a deep laceration with distal tuft fracture. This was closed in the department 3 weeks ago. Patient reports also sensation and movement to the affected digit. Patient reports he is unable to flex his right thumb. Patient was referred to hand surgery but states that he did not follow-up. No new injury or complaint. Patient denies any signs of infection.   Past Medical History:  Diagnosis Date  . Asthma   . COPD (chronic obstructive pulmonary disease) (HCC)     There are no active problems to display for this patient.   History reviewed. No pertinent surgical history.  Prior to Admission medications   Medication Sig Start Date End Date Taking? Authorizing Provider  amoxicillin (AMOXIL) 875 MG tablet Take 1 tablet (875 mg total) by mouth 2 (two) times daily. 08/19/15   Leyton Magoon, Delorise Royals, PA-C  cephALEXin (KEFLEX) 500 MG capsule Take 1 capsule (500 mg total) by mouth 3 (three) times daily. 04/09/17   Irean Hong, MD  ibuprofen (ADVIL,MOTRIN) 800 MG tablet Take 1 tablet (800 mg total) by mouth every 8 (eight) hours as needed for moderate pain. 04/09/17   Irean Hong, MD  magic mouthwash w/lidocaine SOLN Take 5 mLs by mouth 4 (four) times daily. 08/19/15   Ashaya Raftery, Delorise Royals, PA-C  naproxen (NAPROSYN) 500 MG tablet Take 1 tablet (500 mg total) by mouth 2 (two) times daily with a meal. 04/23/16   Hagler, Jami L, PA-C  oxyCODONE-acetaminophen (ROXICET) 5-325 MG tablet Take 1 tablet by mouth every 4 (four) hours as needed for severe pain. 04/09/17   Irean Hong, MD    Allergies Patient has no known  allergies.  No family history on file.  Social History Social History  Substance Use Topics  . Smoking status: Current Every Day Smoker    Packs/day: 0.50    Types: Cigarettes  . Smokeless tobacco: Never Used  . Alcohol use No     Review of Systems  Constitutional: No fever/chills Eyes: No visual changes. Cardiovascular: no chest pain. Respiratory: no cough. No SOB. Gastrointestinal: No abdominal pain.  No nausea, no vomiting.  Musculoskeletal: Negative for musculoskeletal pain. Skin: Patient has a sutured laceration to the right thumb. Neurological: Negative for headaches, focal weakness or numbness. 10-point ROS otherwise negative.  ____________________________________________   PHYSICAL EXAM:  VITAL SIGNS: ED Triage Vitals [04/28/17 1908]  Enc Vitals Group     BP (!) 160/83     Pulse Rate 95     Resp 18     Temp 98.7 F (37.1 C)     Temp Source Oral     SpO2 97 %     Weight 172 lb (78 kg)     Height  (1.651 m)     Head Circumference      Peak Flow      Pain Score 0     Pain Loc      Pain Edu?      Excl. in GC?      Constitutional: Alert and oriented. Well appearing and in no acute distress. Eyes: Conjunctivae are normal. PERRL. EOMI. Head: Atraumatic. Neck: No stridor.  Cardiovascular: Normal rate, regular rhythm. Normal S1 and S2.  Good peripheral circulation. Respiratory: Normal respiratory effort without tachypnea or retractions. Lungs CTAB. Good air entry to the bases with no decreased or absent breath sounds. Musculoskeletal: Full range of motion to all extremities. No gross deformities appreciated. Neurologic:  Normal speech and language. No gross focal neurologic deficits are appreciated.  Skin:  Skin is warm, dry and intact. No rash noted.Laceration to the right thumb is healing well with no signs of infection. No dehiscence. 2 sutures in place. Patient has limited flexion to the thumb. He is able to flex the PIP joint slightly. Limited  sensation to the distal aspect of the thumb. Cap refill intact. Psychiatric: Mood and affect are normal. Speech and behavior are normal. Patient exhibits appropriate insight and judgement.   ____________________________________________   LABS (all labs ordered are listed, but only abnormal results are displayed)  Labs Reviewed - No data to display ____________________________________________  EKG   ____________________________________________  RADIOLOGY   No results found.  ____________________________________________    PROCEDURES  Procedure(s) performed:    .Suture Removal Date/Time: 04/28/2017 8:24 PM Performed by: Gala RomneyUTHRIELL, Nijah Tejera D Authorized by: Gala RomneyUTHRIELL, Millisa Giarrusso D   Consent:    Consent obtained:  Verbal   Consent given by:  Patient Procedure details:    Wound appearance:  No signs of infection and good wound healing   Number of sutures removed:  2 Post-procedure details:    Post-removal:  No dressing applied   Patient tolerance of procedure:  Tolerated well, no immediate complications      Medications - No data to display   ____________________________________________   INITIAL IMPRESSION / ASSESSMENT AND PLAN / ED COURSE  Pertinent labs & imaging results that were available during my care of the patient were reviewed by me and considered in my medical decision making (see chart for details).  Review of the [place] CSRS was performed in accordance of the [facility] prior to dispensing any controlled drugs.     Patient's diagnosis is consistent with suture removal. 2 in place sutures are removed successfully with no signs of dehiscence. No signs of infection. Patient has limited range of motion in flexion. He was referred to hand surgery but did not follow-up. I stressed the importance of following up with hand surgery. He verbalizes that he will follow-up. No new medications at this time.. Patient is given ED precautions to return to the ED for any  worsening or new symptoms.     ____________________________________________  FINAL CLINICAL IMPRESSION(S) / ED DIAGNOSES  Final diagnoses:  Visit for suture removal      NEW MEDICATIONS STARTED DURING THIS VISIT:  New Prescriptions   No medications on file        This chart was dictated using voice recognition software/Dragon. Despite Filter efforts to proofread, errors can occur which can change the meaning. Any change was purely unintentional.    Racheal PatchesCuthriell, Macallister Ashmead D, PA-C 04/28/17 2026    Merrily Brittleifenbark, Neil, MD 04/28/17 2300

## 2017-04-28 NOTE — ED Notes (Signed)
Pt presenting to ED for 2 sutures to be removed from right thumb. Pt denies sx infection. [facility] Dolores FrameSung 3 weeks ago.   Pt also reports loss of movement and sensation to effected digit.

## 2017-10-01 DIAGNOSIS — J40 Bronchitis, not specified as acute or chronic: Secondary | ICD-10-CM | POA: Insufficient documentation

## 2017-10-01 DIAGNOSIS — F17210 Nicotine dependence, cigarettes, uncomplicated: Secondary | ICD-10-CM | POA: Insufficient documentation

## 2017-10-01 DIAGNOSIS — J449 Chronic obstructive pulmonary disease, unspecified: Secondary | ICD-10-CM | POA: Insufficient documentation

## 2017-10-01 DIAGNOSIS — Z79899 Other long term (current) drug therapy: Secondary | ICD-10-CM | POA: Insufficient documentation

## 2017-10-01 NOTE — ED Triage Notes (Signed)
Pt arrives POV to triage with c/o chest pain since 1900 Friday. Pt reports emesis x1. Pt is in NAD.

## 2017-10-02 ENCOUNTER — Encounter: Payer: Self-pay | Admitting: Emergency Medicine

## 2017-10-02 ENCOUNTER — Other Ambulatory Visit: Payer: Self-pay

## 2017-10-02 ENCOUNTER — Emergency Department
Admission: EM | Admit: 2017-10-02 | Discharge: 2017-10-02 | Disposition: A | Discharge status: Home / Self Care | Payer: Self-pay | Attending: Emergency Medicine | Admitting: Emergency Medicine

## 2017-10-02 ENCOUNTER — Emergency Department: Payer: Self-pay

## 2017-10-02 DIAGNOSIS — J40 Bronchitis, not specified as acute or chronic: Secondary | ICD-10-CM

## 2017-10-02 DIAGNOSIS — R05 Cough: Secondary | ICD-10-CM

## 2017-10-02 DIAGNOSIS — R059 Cough, unspecified: Secondary | ICD-10-CM

## 2017-10-02 DIAGNOSIS — R079 Chest pain, unspecified: Secondary | ICD-10-CM

## 2017-10-02 LAB — BASIC METABOLIC PANEL
Anion gap: 9 (ref 5–15)
BUN: 11 mg/dL (ref 6–20)
CALCIUM: 9.1 mg/dL (ref 8.9–10.3)
CHLORIDE: 104 mmol/L (ref 101–111)
CO2: 26 mmol/L (ref 22–32)
Creatinine, Ser: 0.94 mg/dL (ref 0.61–1.24)
GFR calc non Af Amer: >60 mL/min (ref >60)
Glucose, Bld: 108 mg/dL — ABNORMAL HIGH (ref 65–99)
Potassium: 3.4 mmol/L — ABNORMAL LOW (ref 3.5–5.1)
SODIUM: 139 mmol/L (ref 135–145)

## 2017-10-02 LAB — CBC
HCT: 45.2 % (ref 40.0–52.0)
HEMOGLOBIN: 15.6 g/dL (ref 13.0–18.0)
MCH: 32.9 pg (ref 26.0–34.0)
MCHC: 34.5 g/dL (ref 32.0–36.0)
MCV: 95.4 fL (ref 80.0–100.0)
Platelets: 321 10*3/uL (ref 150–440)
RBC: 4.74 MIL/uL (ref 4.40–5.90)
RDW: 11.5 % (ref 11.5–14.5)
WBC: 7.9 10*3/uL (ref 3.8–10.6)

## 2017-10-02 LAB — TROPONIN I

## 2017-10-02 LAB — FIBRIN DERIVATIVES D-DIMER (ARMC ONLY): FIBRIN DERIVATIVES D-DIMER (ARMC): 197.01 ng{FEU}/mL (ref 0.00–499.00)

## 2017-10-02 MED ORDER — HYDROCOD POLST-CPM POLST ER 10-8 MG/5ML PO SUER
5.0000 mL | Freq: Once | ORAL | Status: AC
  Administered 2017-10-02: 5 mL via ORAL
  Filled 2017-10-02: qty 5

## 2017-10-02 MED ORDER — HYDROCOD POLST-CPM POLST ER 10-8 MG/5ML PO SUER: 5.0000 mL | Freq: Two times a day (BID) | ORAL | 0 refills | Status: DC

## 2017-10-02 MED ORDER — IBUPROFEN 800 MG PO TABS: 800.0000 mg | ORAL_TABLET | Freq: Three times a day (TID) | ORAL | 0 refills | Status: DC | PRN

## 2017-10-02 NOTE — ED Notes (Signed)
Pt to the er for chest pain that started Thursday night Friday morning. Pt points to left side of the chest and described as a squeeze. Dry cough x 1 week. Numbness and pain to the left arm from the shoulder down but also in the leg and the whole left side. Cannot identify anything that makes it worse or better. No meds taken at home. Pain feels the same now as it did when it started.

## 2017-10-02 NOTE — ED Provider Notes (Signed)
[facility] Emergency Department Provider Note   ____________________________________________   First MD Initiated Contact with Patient 10/02/17 [phone]     (approximate)  I have reviewed the triage vital signs and the nursing notes.   HISTORY  Chief Complaint Chest Pain    HPI Eric [name] is a 32 y.o. male who presents to the ED from home with a chief complaint of chest pain.  Patient reports left-sided chest tightness since 7 PM.  Denies associated diaphoresis, shortness of breath, palpitations or dizziness.  States he vomited once after coughing.  Complains of cough productive of yellow sputum.  Also states he feels numbness in his left arm, left trunk and his left leg.  Denies associated extremity weakness.  Denies recent fever, chills, abdominal pain, dysuria, diarrhea.  Denies recent travel or trauma.   Past Medical History:  Diagnosis Date  . Asthma   . COPD (chronic obstructive pulmonary disease) (HCC)     There are no active problems to display for this patient.   History reviewed. No pertinent surgical history.  Prior to Admission medications   Medication Sig Start Date End Date Taking? Authorizing Provider  amoxicillin (AMOXIL) 875 MG tablet Take 1 tablet (875 mg total) by mouth 2 (two) times daily. 08/19/15   Cuthriell, Delorise Royals, PA-C  cephALEXin (KEFLEX) 500 MG capsule Take 1 capsule (500 mg total) by mouth 3 (three) times daily. 04/09/17   Irean Hong, MD  ibuprofen (ADVIL,MOTRIN) 800 MG tablet Take 1 tablet (800 mg total) by mouth every 8 (eight) hours as needed for moderate pain. 04/09/17   Irean Hong, MD  magic mouthwash w/lidocaine SOLN Take 5 mLs by mouth 4 (four) times daily. 08/19/15   Cuthriell, Delorise Royals, PA-C  naproxen (NAPROSYN) 500 MG tablet Take 1 tablet (500 mg total) by mouth 2 (two) times daily with a meal. 04/23/16   Hagler, Jami L, PA-C  oxyCODONE-acetaminophen (ROXICET) 5-325 MG tablet Take 1 tablet by mouth every 4  (four) hours as needed for severe pain. 04/09/17   Irean Hong, MD    Allergies Patient has no known allergies.  Family history Grandmother with pacemaker  Social History Social History   Tobacco Use  . Smoking status: Current Every Day Smoker    Packs/day: 1.00    Types: Cigarettes  . Smokeless tobacco: Never Used  Substance Use Topics  . Alcohol use: No  . Drug use: Yes    Types: Marijuana    Review of Systems  Constitutional: No fever/chills Eyes: No visual changes. ENT: No sore throat. Cardiovascular: Positive for chest pain. Respiratory: Positive for productive cough.  Denies shortness of breath. Gastrointestinal: No abdominal pain.  No nausea, no vomiting.  No diarrhea.  No constipation. Genitourinary: Negative for dysuria. Musculoskeletal: Negative for back pain. Skin: Negative for rash. Neurological: Negative for headaches, focal weakness or numbness.   ____________________________________________   PHYSICAL EXAM:  VITAL SIGNS: ED Triage Vitals  Enc Vitals Group     BP 10/01/17 2347 (!) 145/89     Pulse Rate 10/01/17 2347 92     Resp 10/01/17 2347 18     Temp 10/01/17 2347 98 F (36.7 C)     Temp Source 10/01/17 2347 Oral     SpO2 10/01/17 2347 100 %     Weight 10/01/17 2347 182 lb (82.6 kg)     Height 10/01/17 2347 5\' 5"  (1.651 m)     Head Circumference --  Peak Flow --      Pain Score 10/02/17 0000 8     Pain Loc --      Pain Edu? --      Excl. in GC? --     Constitutional: Alert and oriented. Well appearing and in no acute distress. Eyes: Conjunctivae are normal. PERRL. EOMI. Head: Atraumatic. Nose: No congestion/rhinnorhea. Mouth/Throat: Mucous membranes are moist.  Oropharynx non-erythematous. Neck: No stridor.   Cardiovascular: Normal rate, regular rhythm. Grossly normal heart sounds.  Good peripheral circulation. Respiratory: Normal respiratory effort.  No retractions. Lungs CTAB.  Loose cough noted. Gastrointestinal: Soft and  nontender. No distention. No abdominal bruits. No CVA tenderness. Musculoskeletal: No lower extremity tenderness nor edema.  No joint effusions. Neurologic:  Normal speech and language. No gross focal neurologic deficits are appreciated. No gait instability. Skin:  Skin is warm, dry and intact. No rash noted. Psychiatric: Mood and affect are normal. Speech and behavior are normal.  ____________________________________________   LABS (all labs ordered are listed, but only abnormal results are displayed)  Labs Reviewed  BASIC METABOLIC PANEL - Abnormal; Notable for the following components:      Result Value   Potassium 3.4 (*)    Glucose, Bld 108 (*)    All other components within normal limits  CBC  TROPONIN I  TROPONIN I  FIBRIN DERIVATIVES D-DIMER ([facility] ONLY)   ____________________________________________  EKG  ED ECG REPORT I, Mustapha Colson J, the attending physician, personally viewed and interpreted this ECG.   Date: 10/02/2017  EKG Time: 2352  Rate: 83  Rhythm: normal EKG, normal sinus rhythm  Axis: Normal  Intervals:none  ST&T Change: Nonspecific  ____________________________________________  RADIOLOGY  ED MD interpretation: Bronchitis, no pneumonia  Official radiology report(s): Dg Chest 2 View  Result Date: 10/02/2017 CLINICAL DATA:  Chest pain.  Cough for 1 week. EXAM: CHEST  2 VIEW COMPARISON:  Radiographs 03/08/2015 FINDINGS: The cardiomediastinal contours are normal. Moderate bronchial thickening. Pulmonary vasculature is normal. No consolidation, pleural effusion, or pneumothorax. No acute osseous abnormalities are seen. IMPRESSION: Moderate bronchial thickening, suggesting asthma or bronchitis. Electronically Signed   By: Rubye OaksMelanie  Ehinger M.D.   On: 10/02/2017 00:38    ____________________________________________   PROCEDURES  Procedure(s) performed: None  Procedures  Critical Care performed:  No  ____________________________________________   INITIAL IMPRESSION / ASSESSMENT AND PLAN / ED COURSE  As part of my medical decision making, I reviewed the following data within the electronic MEDICAL RECORD NUMBER Nursing notes reviewed and incorporated, Labs reviewed, EKG interpreted, Old chart reviewed, Radiograph reviewed and Notes from prior ED visits.   32 year old male who presents with chest pain, productive cough and numbness. Differential diagnosis includes, but is not limited to, ACS, aortic dissection, pulmonary embolism, cardiac tamponade, pneumothorax, pneumonia, pericarditis, myocarditis, GI-related causes including esophagitis/gastritis, and musculoskeletal chest wall pain.    Symmetrical blood pressures in both arms, palpable distal pulses, no numbness at the time of examination.  Aortic dissection less likely.  Will repeat troponin, check d-dimer.  Most likely patient has chest wall pain secondary to cough and bronchitis.  Will dose with Tussionex and reassess.  Clinical Course as of Oct 02 540  Sat Oct 02, 2017  [id number] Patient resting in no acute distress.  Updated him of negative troponin and d-dimer.  Will discharge home on NSAIDs, Tussionex and he will follow-up closely with his PCP next week.  Strict return precautions given.  Patient verbalizes understanding and agrees with plan of care.  [JS]  Clinical Course User Index [JS] Irean Hong, MD     ____________________________________________   FINAL CLINICAL IMPRESSION(S) / ED DIAGNOSES  Final diagnoses:  Nonspecific chest pain  Bronchitis  Cough     ED Discharge Orders    None       Note:  This document was prepared using Dragon voice recognition software and may include unintentional dictation errors.    Irean Hong, MD 10/02/17 [phone]

## 2017-10-02 NOTE — Discharge Instructions (Signed)
1.  You may take medicines as needed for pain and cough (Motrin/Tussionex). 2.  Return to the ER for worsening symptoms, persistent vomiting, difficulty breathing or other concerns.

## 2018-01-31 ENCOUNTER — Encounter: Payer: Self-pay | Admitting: Intensive Care

## 2018-01-31 ENCOUNTER — Emergency Department
Admission: EM | Admit: 2018-01-31 | Discharge: 2018-01-31 | Disposition: A | Discharge status: Home / Self Care | Payer: Self-pay | Attending: Emergency Medicine | Admitting: Emergency Medicine

## 2018-01-31 ENCOUNTER — Other Ambulatory Visit: Payer: Self-pay

## 2018-01-31 DIAGNOSIS — J449 Chronic obstructive pulmonary disease, unspecified: Secondary | ICD-10-CM | POA: Insufficient documentation

## 2018-01-31 DIAGNOSIS — F17210 Nicotine dependence, cigarettes, uncomplicated: Secondary | ICD-10-CM | POA: Insufficient documentation

## 2018-01-31 DIAGNOSIS — H9201 Otalgia, right ear: Secondary | ICD-10-CM | POA: Insufficient documentation

## 2018-01-31 MED ORDER — CIPROFLOXACIN-HYDROCORTISONE 0.2-1 % OT SUSP: 3.0000 [drp] | Freq: Two times a day (BID) | OTIC | 0 refills | Status: AC

## 2018-01-31 NOTE — ED Notes (Signed)
Pt states clear drainage from left ear, darker drainage from right ear.

## 2018-01-31 NOTE — ED Provider Notes (Signed)
[facility] Emergency Department Provider Note  ____________________________________________  Time seen: Approximately 7:35 PM  I have reviewed the triage vital signs and the nursing notes.   HISTORY  Chief Complaint Otalgia (R ear)    HPI Eric [name] is a 32 y.o. male that presents to the emergency department for evaluation of right ear pain for "his entire life" worsening over the last 1.5 weeks.  He states that there is dark drainage from the right ear and clear drainage in left ear.  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 No history of allergies. No fever, chills, nasal congestion, sore throat, cough.  Past Medical History:  Diagnosis Date  . Asthma   . COPD (chronic obstructive pulmonary disease) (HCC)     There are no active problems to display for this patient.   History reviewed. No pertinent surgical history.  Prior to Admission medications   Medication Sig Start Date End Date Taking? Authorizing Provider  amoxicillin (AMOXIL) 875 MG tablet Take 1 tablet (875 mg total) by mouth 2 (two) times daily. 08/19/15   Cuthriell, Delorise Royals, PA-C  cephALEXin (KEFLEX) 500 MG capsule Take 1 capsule (500 mg total) by mouth 3 (three) times daily. 04/09/17   Irean Hong, MD  chlorpheniramine-HYDROcodone (TUSSIONEX PENNKINETIC ER) 10-8 MG/5ML SUER Take 5 mLs by mouth 2 (two) times daily. 10/02/17   Irean Hong, MD  ciprofloxacin-hydrocortisone (CIPRO HC) OTIC suspension Place 3 drops into the right ear 2 (two) times daily for 7 days. 01/31/18 02/07/18  Enid Derry, PA-C  ibuprofen (ADVIL,MOTRIN) 800 MG tablet Take 1 tablet (800 mg total) by mouth every 8 (eight) hours as needed for moderate pain. 10/02/17   Irean Hong, MD  magic mouthwash w/lidocaine SOLN Take 5 mLs by mouth 4 (four) times daily. 08/19/15   Cuthriell, Delorise Royals, PA-C  naproxen (NAPROSYN) 500 MG tablet Take 1 tablet (500 mg total) by mouth 2 (two) times  daily with a meal. 04/23/16   Hagler, Jami L, PA-C  oxyCODONE-acetaminophen (ROXICET) 5-325 MG tablet Take 1 tablet by mouth every 4 (four) hours as needed for severe pain. 04/09/17   Irean Hong, MD    Allergies Patient has no known allergies.  History reviewed. No pertinent family history.  Social History Social History   Tobacco Use  . Smoking status: Current Every Day Smoker    Packs/day: 1.00    Types: Cigarettes  . Smokeless tobacco: Never Used  Substance Use Topics  . Alcohol use: No  . Drug use: Yes    Types: Marijuana     Review of Systems  Constitutional: No fever/chills Cardiovascular: No chest pain. Respiratory: No SOB. Gastrointestinal: No abdominal pain.  No nausea, no vomiting.  Musculoskeletal: Negative for musculoskeletal pain. Skin: Negative for rash, abrasions, lacerations, ecchymosis. Neurological: Negative for headaches   ____________________________________________   PHYSICAL EXAM:  VITAL SIGNS: ED Triage Vitals  Enc Vitals Group     BP 01/31/18 1846 134/82     Pulse Rate 01/31/18 1846 82     Resp 01/31/18 1846 14     Temp 01/31/18 1846 98.7 F (37.1 C)     Temp Source 01/31/18 1846 Oral     SpO2 01/31/18 1846 96 %     Weight 01/31/18 1847 170 lb (77.1 kg)     Height 01/31/18 1847 5\' 4"  (1.626 m)     Head Circumference --      Peak Flow --  Pain Score 01/31/18 1847 7     Pain Loc --      Pain Edu? --      Excl. in GC? --      Constitutional: Alert and oriented. Well appearing and in no acute distress. Eyes: Conjunctivae are normal. PERRL. EOMI. Head: Atraumatic. ENT:      Ears: Tenderness to palpation of right pinna.  Scarring to right ear canal.  No mastoid tenderness or erythema.  Left tympanic membrane pearly. Tube in left ear.       Nose: No congestion/rhinnorhea.      Mouth/Throat: Mucous membranes are moist.  Neck: No stridor.   Cardiovascular: Normal rate, regular rhythm.  Good peripheral circulation. Respiratory:  Normal respiratory effort without tachypnea or retractions. Lungs CTAB. Good air entry to the bases with no decreased or absent breath sounds. Musculoskeletal: Full range of motion to all extremities. No gross deformities appreciated. Neurologic:  Normal speech and language. No gross focal neurologic deficits are appreciated.  Skin:  Skin is warm, dry and intact. No rash noted.   ____________________________________________   LABS (all labs ordered are listed, but only abnormal results are displayed)  Labs Reviewed - No data to display ____________________________________________  EKG   ____________________________________________  RADIOLOGY   No results found.  ____________________________________________    PROCEDURES  Procedure(s) performed:    Procedures    Medications - No data to display   ____________________________________________   INITIAL IMPRESSION / ASSESSMENT AND PLAN / ED COURSE  Pertinent labs & imaging results that were available during my care of the patient were reviewed by me and considered in my medical decision making (see chart for details).  Review of the [place] CSRS was performed in accordance of the [facility] prior to dispensing any controlled drugs.  Patient presented to the emergency department for evaluation of right ear pain. Vital signs and exam are reassuring. Patient will be discharged home with prescriptions for cipro HC. Patient is to follow up with ENT as directed. Patient is given ED precautions to return to the ED for any worsening or new symptoms.     ____________________________________________  FINAL CLINICAL IMPRESSION(S) / ED DIAGNOSES  Final diagnoses:  Otalgia of right ear      NEW MEDICATIONS STARTED DURING THIS VISIT:  ED Discharge Orders        Ordered    ciprofloxacin-hydrocortisone (CIPRO HC) OTIC suspension  2 times daily     01/31/18 1953          This chart was dictated using voice recognition  software/Dragon. Despite [place] efforts to proofread, errors can occur which can change the meaning. Any change was purely unintentional.    Enid DerryWagner, Zelta Enfield, PA-C 01/31/18 2039    Nita SickleVeronese, Nichols, MD 01/31/18 2149

## 2018-01-31 NOTE — ED Triage Notes (Signed)
Patient states his R ear has been hurting him X1 week. Reports dark brown drainage with R ear and clear drainage with L ear.

## 2018-04-21 ENCOUNTER — Emergency Department
Admission: EM | Admit: 2018-04-21 | Discharge: 2018-04-21 | Disposition: A | Discharge status: Home / Self Care | Payer: Self-pay | Attending: Emergency Medicine | Admitting: Emergency Medicine

## 2018-04-21 ENCOUNTER — Other Ambulatory Visit: Payer: Self-pay

## 2018-04-21 ENCOUNTER — Encounter: Payer: Self-pay | Admitting: Emergency Medicine

## 2018-04-21 DIAGNOSIS — K529 Noninfective gastroenteritis and colitis, unspecified: Secondary | ICD-10-CM | POA: Insufficient documentation

## 2018-04-21 LAB — COMPREHENSIVE METABOLIC PANEL
ALK PHOS: 71 U/L (ref 38–126)
ALT: 51 U/L — ABNORMAL HIGH (ref 0–44)
ANION GAP: 5 (ref 5–15)
AST: 38 U/L (ref 15–41)
Albumin: 4.6 g/dL (ref 3.5–5.0)
BUN: 13 mg/dL (ref 6–20)
CALCIUM: 9.3 mg/dL (ref 8.9–10.3)
CHLORIDE: 106 mmol/L (ref 98–111)
CO2: 29 mmol/L (ref 22–32)
Creatinine, Ser: 0.8 mg/dL (ref 0.61–1.24)
GFR calc non Af Amer: >60 mL/min (ref >60)
Glucose, Bld: 94 mg/dL (ref 70–99)
Potassium: 3.5 mmol/L (ref 3.5–5.1)
Sodium: 140 mmol/L (ref 135–145)
Total Bilirubin: 0.8 mg/dL (ref 0.3–1.2)
Total Protein: 7.5 g/dL (ref 6.5–8.1)

## 2018-04-21 LAB — CBC
HCT: 46 % (ref 40.0–52.0)
HEMOGLOBIN: 16.2 g/dL (ref 13.0–18.0)
MCH: 33.8 pg (ref 26.0–34.0)
MCHC: 35.2 g/dL (ref 32.0–36.0)
MCV: 96 fL (ref 80.0–100.0)
Platelets: 310 10*3/uL (ref 150–440)
RBC: 4.79 MIL/uL (ref 4.40–5.90)
RDW: 11.3 % — ABNORMAL LOW (ref 11.5–14.5)
WBC: 7.3 10*3/uL (ref 3.8–10.6)

## 2018-04-21 LAB — LIPASE, BLOOD: LIPASE: 47 U/L (ref 11–51)

## 2018-04-21 MED ORDER — DICYCLOMINE HCL 20 MG PO TABS: 20.0000 mg | ORAL_TABLET | Freq: Three times a day (TID) | ORAL | 0 refills | Status: DC | PRN

## 2018-04-21 MED ORDER — ONDANSETRON HCL 4 MG PO TABS: 4.0000 mg | ORAL_TABLET | Freq: Three times a day (TID) | ORAL | 0 refills | Status: DC | PRN

## 2018-04-21 NOTE — Discharge Instructions (Addendum)
Please seek medical attention for any high fevers, chest pain, shortness of breath, change in behavior, persistent vomiting, bloody stool or any other new or concerning symptoms.  

## 2018-04-21 NOTE — ED Provider Notes (Signed)
[facility] Emergency Department Provider Note  ____________________________________________   I have reviewed the triage vital signs and the nursing notes.   HISTORY  Chief Complaint Diarrhea   History limited by: Not Limited   HPI Eric [name] is a 32 Eric.o. male who presents to the emergency department today because of concerns for diarrhea and need for work note.  The patient states that he started having diarrhea this morning.  He had multiple episodes.  They are watery.  He denies any blood in it.  Has been associate by abdominal pain.  Some nausea but no vomiting.  No fevers.  He states that someone else at his work was sick recently.   Per medical record review patient has a history of asthma, COPD  Past Medical History:  Diagnosis Date  . Asthma   . COPD (chronic obstructive pulmonary disease) (HCC)     There are no active problems to display for this patient.   History reviewed. No pertinent surgical history.  Prior to Admission medications   Medication Sig Start Date End Date Taking? Authorizing Provider  amoxicillin (AMOXIL) 875 MG tablet Take 1 tablet (875 mg total) by mouth 2 (two) times daily. 08/19/15   Cuthriell, Delorise Royals, PA-C  cephALEXin (KEFLEX) 500 MG capsule Take 1 capsule (500 mg total) by mouth 3 (three) times daily. 04/09/17   Irean Hong, MD  chlorpheniramine-HYDROcodone (TUSSIONEX PENNKINETIC ER) 10-8 MG/5ML SUER Take 5 mLs by mouth 2 (two) times daily. 10/02/17   Irean Hong, MD  ibuprofen (ADVIL,MOTRIN) 800 MG tablet Take 1 tablet (800 mg total) by mouth every 8 (eight) hours as needed for moderate pain. 10/02/17   Irean Hong, MD  magic mouthwash w/lidocaine SOLN Take 5 mLs by mouth 4 (four) times daily. 08/19/15   Cuthriell, Delorise Royals, PA-C  naproxen (NAPROSYN) 500 MG tablet Take 1 tablet (500 mg total) by mouth 2 (two) times daily with a meal. 04/23/16   Hagler, Jami L, PA-C  oxyCODONE-acetaminophen (ROXICET) 5-325 MG tablet  Take 1 tablet by mouth every 4 (four) hours as needed for severe pain. 04/09/17   Irean Hong, MD    Allergies Patient has no known allergies.  History reviewed. No pertinent family history.  Social History Social History   Tobacco Use  . Smoking status: Current Every Day Smoker    Packs/day: 1.00    Types: Cigarettes  . Smokeless tobacco: Never Used  Substance Use Topics  . Alcohol use: No  . Drug use: Yes    Types: Marijuana    Review of Systems Constitutional: No fever/chills Eyes: No visual changes. ENT: No sore throat. Cardiovascular: Denies chest pain. Respiratory: Denies shortness of breath. Gastrointestinal: Positive for abdominal pain, diarrhea. Genitourinary: Negative for dysuria. Musculoskeletal: Negative for back pain. Skin: Negative for rash. Neurological: Negative for headaches, focal weakness or numbness.  ____________________________________________   PHYSICAL EXAM:  VITAL SIGNS: ED Triage Vitals  Enc Vitals Group     BP 04/21/18 1843 (!) 136/99     Pulse Rate 04/21/18 1843 (!) 57     Resp 04/21/18 1843 20     Temp 04/21/18 1843 98.5 F (36.9 C)     Temp Source 04/21/18 1843 Oral     SpO2 04/21/18 1843 98 %     Weight 04/21/18 1844 175 lb (79.4 kg)     Height 04/21/18 1844 5\' 5"  (1.651 m)     Head Circumference --      Peak Flow --  Pain Score 04/21/18 1844 7   Constitutional: Alert and oriented.  Eyes: Conjunctivae are normal.  ENT      Head: Normocephalic and atraumatic.      Nose: No congestion/rhinnorhea.      Mouth/Throat: Mucous membranes are moist.      Neck: No stridor. Hematological/Lymphatic/Immunilogical: No cervical lymphadenopathy. Cardiovascular: Normal rate, regular rhythm.  No murmurs, rubs, or gallops.  Respiratory: Normal respiratory effort without tachypnea nor retractions. Breath sounds are clear and equal bilaterally. No wheezes/rales/rhonchi. Gastrointestinal: Soft and non tender. No rebound. No guarding.   Genitourinary: Deferred Musculoskeletal: Normal range of motion in all extremities. No lower extremity edema. Neurologic:  Normal speech and language. No gross focal neurologic deficits are appreciated.  Skin:  Skin is warm, dry and intact. No rash noted. Psychiatric: Mood and affect are normal. Speech and behavior are normal. Patient exhibits appropriate insight and judgment.  ____________________________________________    LABS (pertinent positives/negatives)  Lipase 47 CMP wnl except alt 51 CBC wbc 7.3, hgb 16.2, plt 310  ____________________________________________   EKG  None  ____________________________________________    RADIOLOGY  None  ____________________________________________   PROCEDURES  Procedures  ____________________________________________   INITIAL IMPRESSION / ASSESSMENT AND PLAN / ED COURSE  Pertinent labs & imaging results that were available during my care of the patient were reviewed by me and considered in my medical decision making (see chart for details).   Patient presented to the emergency department today because of concerns for diarrhea and some abdominal pain.  I think gastroenteritis likely.  Will give patient prescription for Bentyl and nausea medications.  Discussed plan with patient.  ____________________________________________   FINAL CLINICAL IMPRESSION(S) / ED DIAGNOSES  Final diagnoses:  Gastroenteritis     Note: This dictation was prepared with Dragon dictation. Any transcriptional errors that result from this process are unintentional     Phineas Semen, MD 04/21/18 2056

## 2018-04-21 NOTE — ED Triage Notes (Signed)
Pt reports that he is having diarrhea. He states that he was fine yesterday and feels that he has had 3 times before coming and has had one episode while in the lobby.

## 2018-05-18 ENCOUNTER — Other Ambulatory Visit: Payer: Self-pay

## 2018-05-18 ENCOUNTER — Encounter: Payer: Self-pay | Admitting: Emergency Medicine

## 2018-05-18 ENCOUNTER — Emergency Department
Admission: EM | Admit: 2018-05-18 | Discharge: 2018-05-18 | Disposition: A | Discharge status: Home / Self Care | Payer: Self-pay | Attending: Emergency Medicine | Admitting: Emergency Medicine

## 2018-05-18 DIAGNOSIS — Y929 Unspecified place or not applicable: Secondary | ICD-10-CM | POA: Insufficient documentation

## 2018-05-18 DIAGNOSIS — Y9389 Activity, other specified: Secondary | ICD-10-CM | POA: Insufficient documentation

## 2018-05-18 DIAGNOSIS — F17210 Nicotine dependence, cigarettes, uncomplicated: Secondary | ICD-10-CM | POA: Insufficient documentation

## 2018-05-18 DIAGNOSIS — X58XXXA Exposure to other specified factors, initial encounter: Secondary | ICD-10-CM | POA: Insufficient documentation

## 2018-05-18 DIAGNOSIS — Y998 Other external cause status: Secondary | ICD-10-CM | POA: Insufficient documentation

## 2018-05-18 DIAGNOSIS — S025XXA Fracture of tooth (traumatic), initial encounter for closed fracture: Secondary | ICD-10-CM | POA: Insufficient documentation

## 2018-05-18 DIAGNOSIS — J449 Chronic obstructive pulmonary disease, unspecified: Secondary | ICD-10-CM | POA: Insufficient documentation

## 2018-05-18 DIAGNOSIS — K047 Periapical abscess without sinus: Secondary | ICD-10-CM | POA: Insufficient documentation

## 2018-05-18 DIAGNOSIS — Z79899 Other long term (current) drug therapy: Secondary | ICD-10-CM | POA: Insufficient documentation

## 2018-05-18 MED ORDER — AMOXICILLIN 875 MG PO TABS: 875.0000 mg | ORAL_TABLET | Freq: Two times a day (BID) | ORAL | 0 refills | Status: DC

## 2018-05-18 MED ORDER — AMOXICILLIN 500 MG PO CAPS
1000.0000 mg | ORAL_CAPSULE | Freq: Once | ORAL | Status: AC
  Administered 2018-05-18: 1000 mg via ORAL
  Filled 2018-05-18: qty 2

## 2018-05-18 MED ORDER — MAGIC MOUTHWASH W/LIDOCAINE: 5.0000 mL | Freq: Four times a day (QID) | ORAL | 0 refills | Status: DC

## 2018-05-18 NOTE — ED Triage Notes (Signed)
Patient states that tooth broke on the left side of mouth 2 days ago and face and mouth has started to swell and dentist has told him in the past "they wont pull it til he has been on antibiotics".

## 2018-05-18 NOTE — ED Provider Notes (Signed)
[facility] Emergency Department Provider Note  ____________________________________________  Time seen: Approximately 7:55 PM  I have reviewed the triage vital signs and the nursing notes.   HISTORY  Chief Complaint Dental Pain    HPI Eric [name] is a 32 y.o. male who presents the emergency department complaining of left lower dental pain and swelling.  Patient reports that he was eating an apple when he fractured tooth approximately 3 days ago.  Since then he has had increasing pain, swelling to the region.  He called his dentist and was advised to receive an antibiotic prior to remaining fragment extraction.  Patient denies any fevers or chills, difficulty breathing or swallowing.  No other complaints at this time.  Patient is taken Tylenol and Motrin at home prior to arrival   Past Medical History:  Diagnosis Date  . Asthma   . COPD (chronic obstructive pulmonary disease) (HCC)     There are no active problems to display for this patient.   History reviewed. No pertinent surgical history.  Prior to Admission medications   Medication Sig Start Date End Date Taking? Authorizing Provider  amoxicillin (AMOXIL) 875 MG tablet Take 1 tablet (875 mg total) by mouth 2 (two) times daily. 05/18/18   Cuthriell, Delorise Royals, PA-C  cephALEXin (KEFLEX) 500 MG capsule Take 1 capsule (500 mg total) by mouth 3 (three) times daily. 04/09/17   Irean Hong, MD  chlorpheniramine-HYDROcodone (TUSSIONEX PENNKINETIC ER) 10-8 MG/5ML SUER Take 5 mLs by mouth 2 (two) times daily. 10/02/17   Irean Hong, MD  dicyclomine (BENTYL) 20 MG tablet Take 1 tablet (20 mg total) by mouth 3 (three) times daily as needed (abdominal pain). 04/21/18   Phineas Semen, MD  ibuprofen (ADVIL,MOTRIN) 800 MG tablet Take 1 tablet (800 mg total) by mouth every 8 (eight) hours as needed for moderate pain. 10/02/17   Irean Hong, MD  magic mouthwash w/lidocaine SOLN Take 5 mLs by mouth 4 (four) times  daily. 05/18/18   Cuthriell, Delorise Royals, PA-C  naproxen (NAPROSYN) 500 MG tablet Take 1 tablet (500 mg total) by mouth 2 (two) times daily with a meal. 04/23/16   Hagler, Jami L, PA-C  ondansetron (ZOFRAN) 4 MG tablet Take 1 tablet (4 mg total) by mouth every 8 (eight) hours as needed for nausea or vomiting. 04/21/18   Phineas Semen, MD  oxyCODONE-acetaminophen (ROXICET) 5-325 MG tablet Take 1 tablet by mouth every 4 (four) hours as needed for severe pain. 04/09/17   Irean Hong, MD    Allergies Patient has no known allergies.  No family history on file.  Social History Social History   Tobacco Use  . Smoking status: Current Every Day Smoker    Packs/day: 1.00    Types: Cigarettes  . Smokeless tobacco: Never Used  Substance Use Topics  . Alcohol use: No  . Drug use: Yes    Types: Marijuana     Review of Systems  Constitutional: No fever/chills Eyes: No visual changes. No discharge ENT: Positive for left lower dental pain Cardiovascular: no chest pain. Respiratory: no cough. No SOB. Gastrointestinal: No abdominal pain.  No nausea, no vomiting.  No diarrhea.  No constipation. Musculoskeletal: Negative for musculoskeletal pain. Skin: Negative for rash, abrasions, lacerations, ecchymosis. Neurological: Negative for headaches, focal weakness or numbness. 10-point ROS otherwise negative.  ____________________________________________   PHYSICAL EXAM:  VITAL SIGNS: ED Triage Vitals  Enc Vitals Group     BP 05/18/18 1915 (!) 117/93  Pulse Rate 05/18/18 1915 78     Resp 05/18/18 1915 16     Temp 05/18/18 1915 98.6 F (37 C)     Temp Source 05/18/18 1915 Oral     SpO2 05/18/18 1915 98 %     Weight 05/18/18 1916 170 lb (77.1 kg)     Height 05/18/18 1916 5\' 6"  (1.676 m)     Head Circumference --      Peak Flow --      Pain Score 05/18/18 1922 10     Pain Loc --      Pain Edu? --      Excl. in GC? --      Constitutional: Alert and oriented. Well appearing and in no  acute distress. Eyes: Conjunctivae are normal. PERRL. EOMI. Head: Atraumatic. ENT:      Ears:       Nose: No congestion/rhinnorhea.      Mouth/Throat: Mucous membranes are moist.  Tooth #18 is fractured with surrounding erythema and edema.  No drainage.  Uvula is midline.  No other indication of intraoral trauma or infection.  No indication of deep space infection or Ludwig's angina.  No visible external edema or erythema. Neck: No stridor.   Hematological/Lymphatic/Immunilogical: No cervical lymphadenopathy. Cardiovascular: Normal rate, regular rhythm. Normal S1 and S2.  Good peripheral circulation. Respiratory: Normal respiratory effort without tachypnea or retractions. Lungs CTAB. Good air entry to the bases with no decreased or absent breath sounds. Musculoskeletal: Full range of motion to all extremities. No gross deformities appreciated. Neurologic:  Normal speech and language. No gross focal neurologic deficits are appreciated.  Skin:  Skin is warm, dry and intact. No rash noted. Psychiatric: Mood and affect are normal. Speech and behavior are normal. Patient exhibits appropriate insight and judgement.   ____________________________________________   LABS (all labs ordered are listed, but only abnormal results are displayed)  Labs Reviewed - No data to display ____________________________________________  EKG   ____________________________________________  RADIOLOGY   No results found.  ____________________________________________    PROCEDURES  Procedure(s) performed:    Procedures    Medications  amoxicillin (AMOXIL) capsule 1,000 mg (has no administration in time range)     ____________________________________________   INITIAL IMPRESSION / ASSESSMENT AND PLAN / ED COURSE  Pertinent labs & imaging results that were available during my care of the patient were reviewed by me and considered in my medical decision making (see chart for  details).  Review of the  CSRS was performed in accordance of the [facility] prior to dispensing any controlled drugs.      Patient's diagnosis is consistent with dental fracture with surrounding dental infection.  Patient presented to the emergency department after breaking a tooth 3 days ago.  Mild surrounding erythema and edema consistent with infection.  Patient will be placed on amoxicillin, given Magic mouthwash for symptom improvement.  Follow-up with dentist. Patient is given ED precautions to return to the ED for any worsening or new symptoms.     ____________________________________________  FINAL CLINICAL IMPRESSION(S) / ED DIAGNOSES  Final diagnoses:  Closed fracture of tooth, initial encounter  Dental infection      NEW MEDICATIONS STARTED DURING THIS VISIT:  ED Discharge Orders         Ordered    amoxicillin (AMOXIL) 875 MG tablet  2 times daily     05/18/18 2004    magic mouthwash w/lidocaine SOLN  4 times daily     05/18/18 2004  This chart was dictated using voice recognition software/Dragon. Despite [place] efforts to proofread, errors can occur which can change the meaning. Any change was purely unintentional.    Racheal Patches, PA-C 05/18/18 2007    Jeanmarie Plant, MD 05/18/18 2028

## 2018-08-14 IMAGING — DX DG HAND COMPLETE 3+V*L*
3 series · 3 of 3 positions shown · non-contrast
Comparison: None.

CLINICAL DATA: Pt was wrestling with son last night and hurt his
left hand. His pain is in the 5th finger. No previous injury.

EXAM:
LEFT HAND - COMPLETE 3+ VIEW

[hand ap]
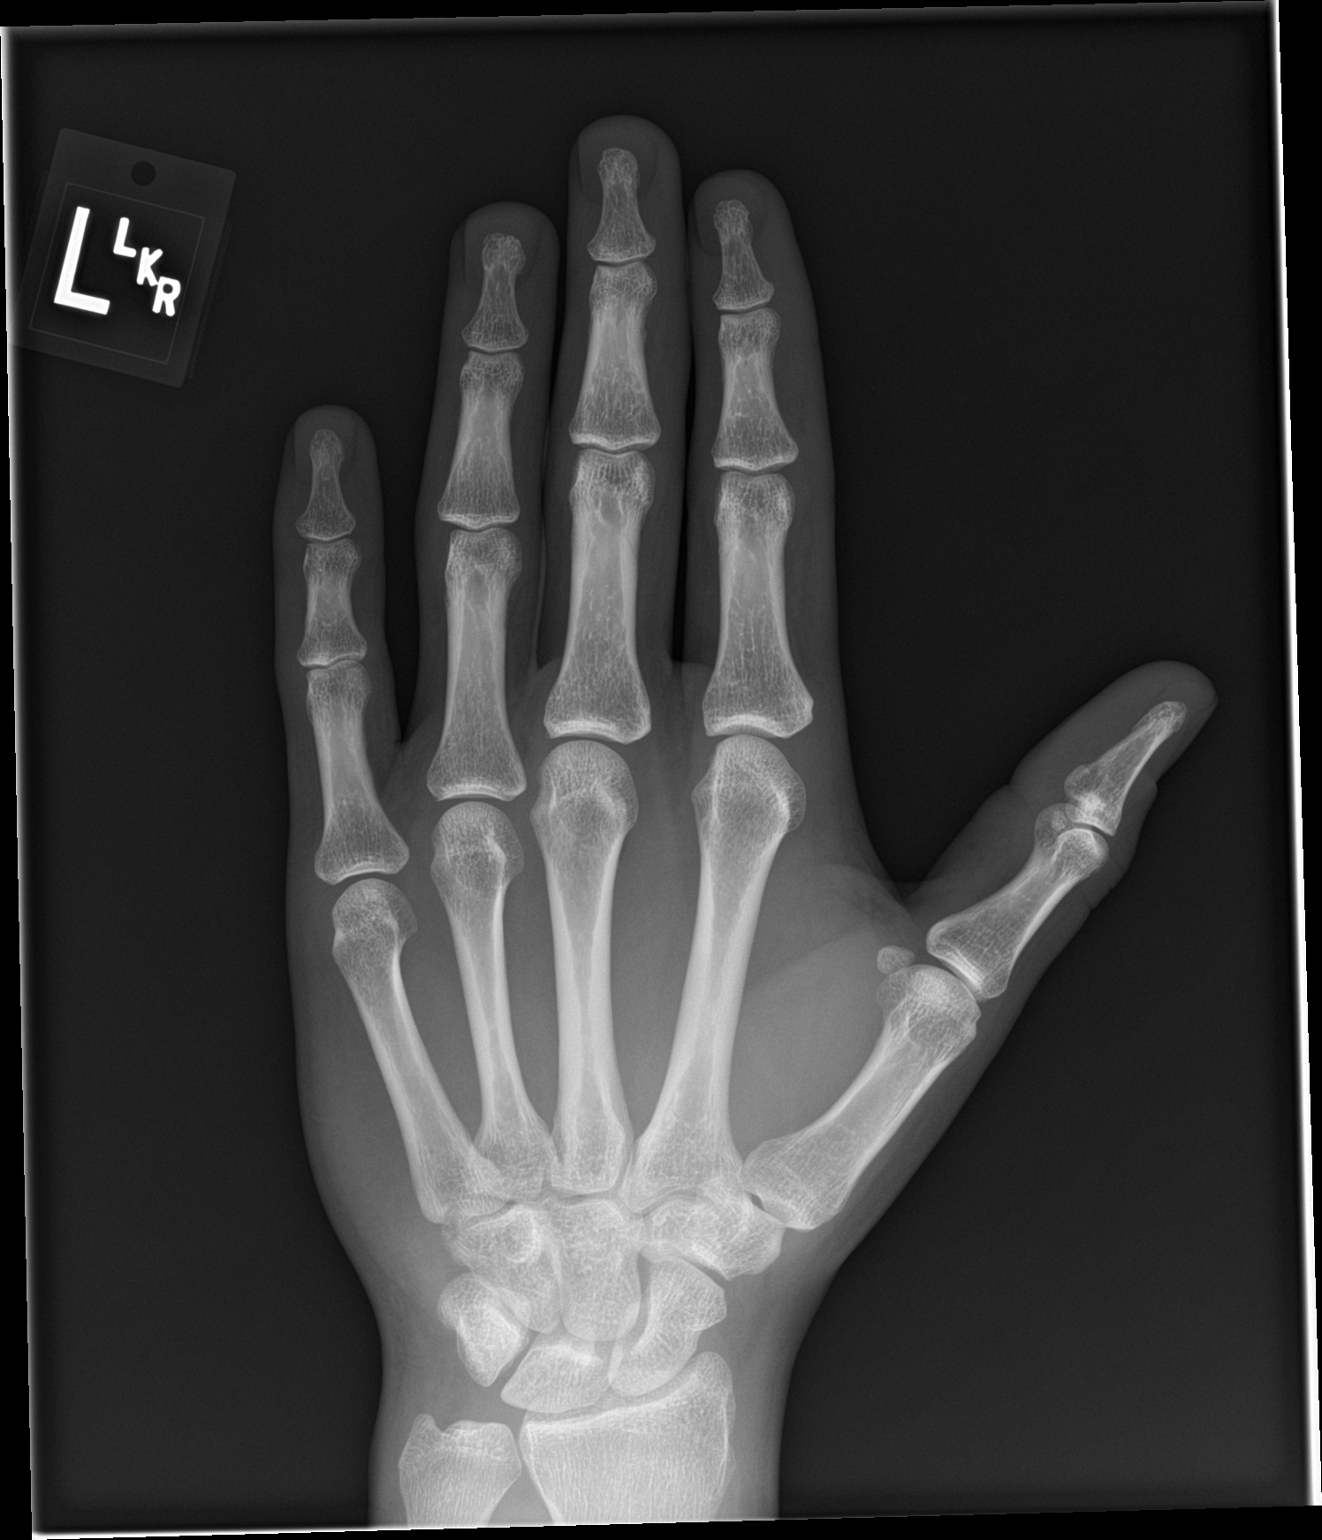

[hand obl]
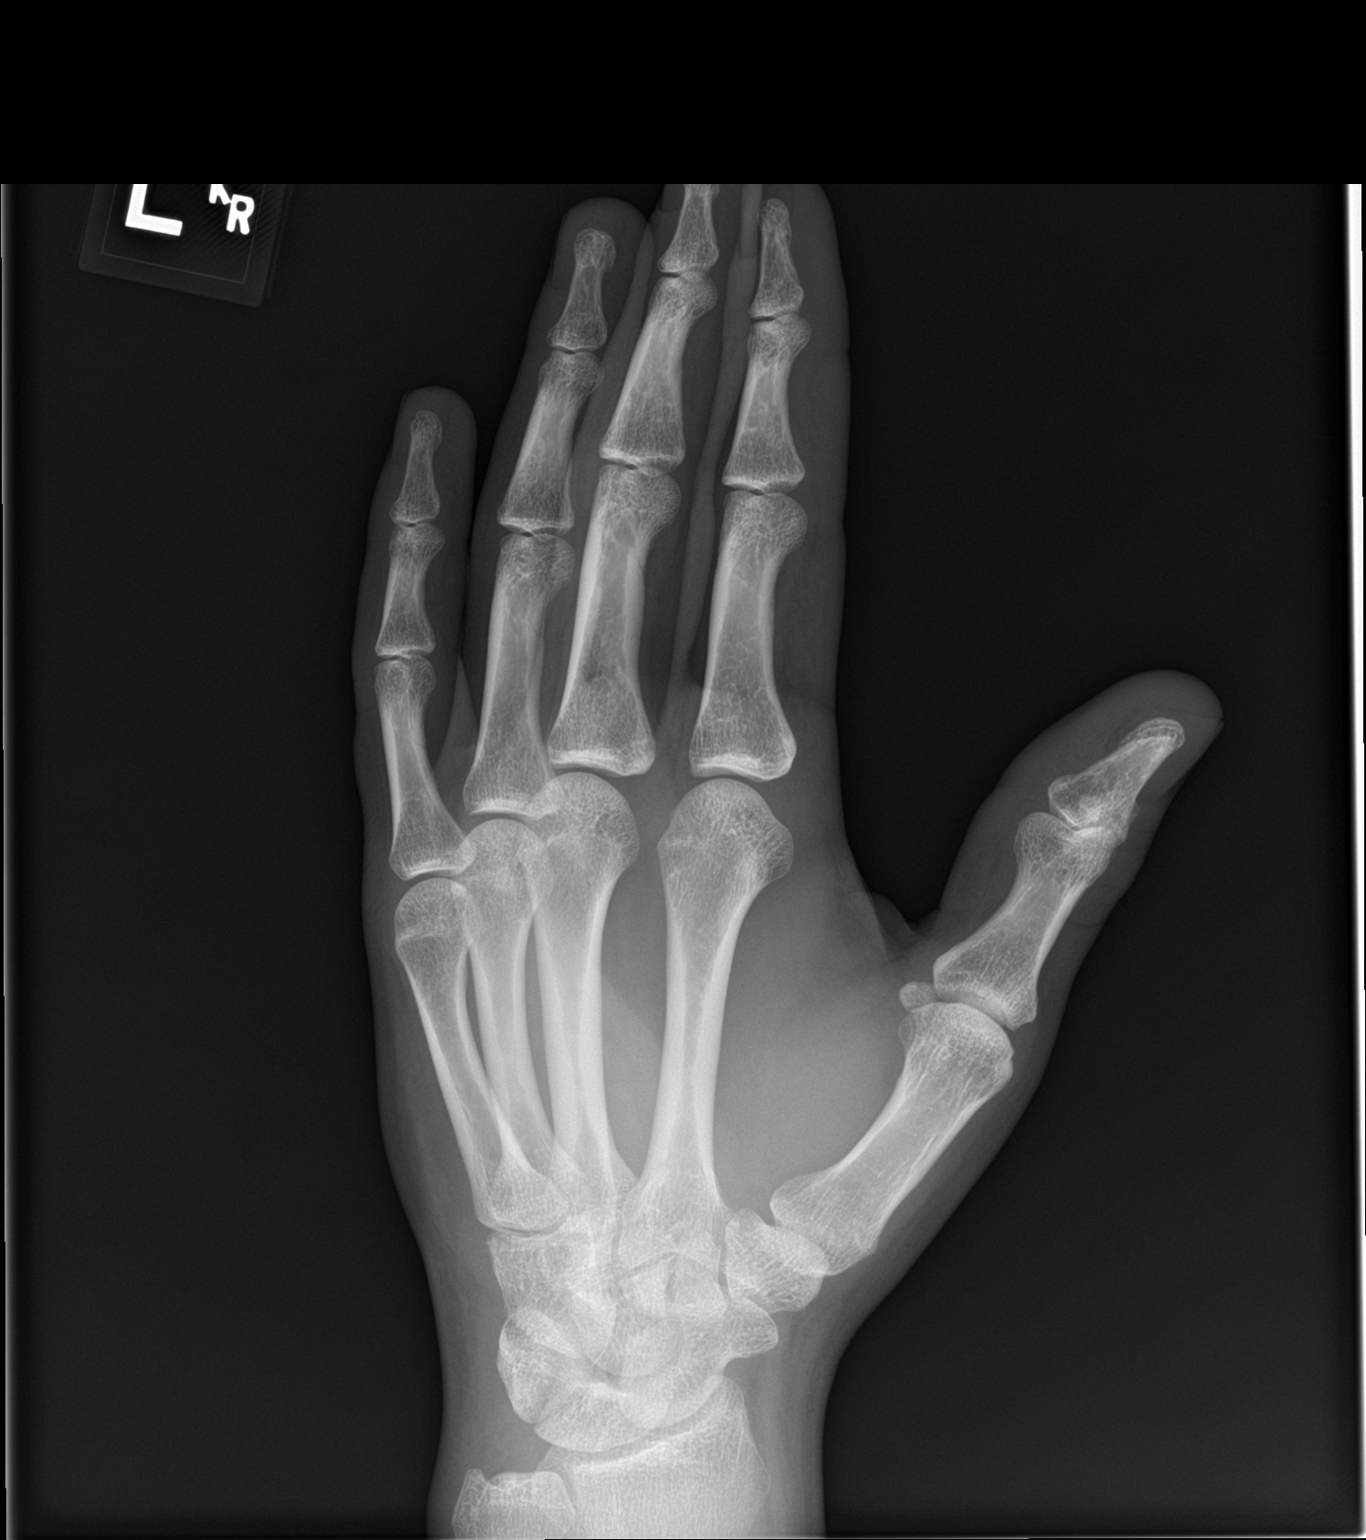

[hand lat]
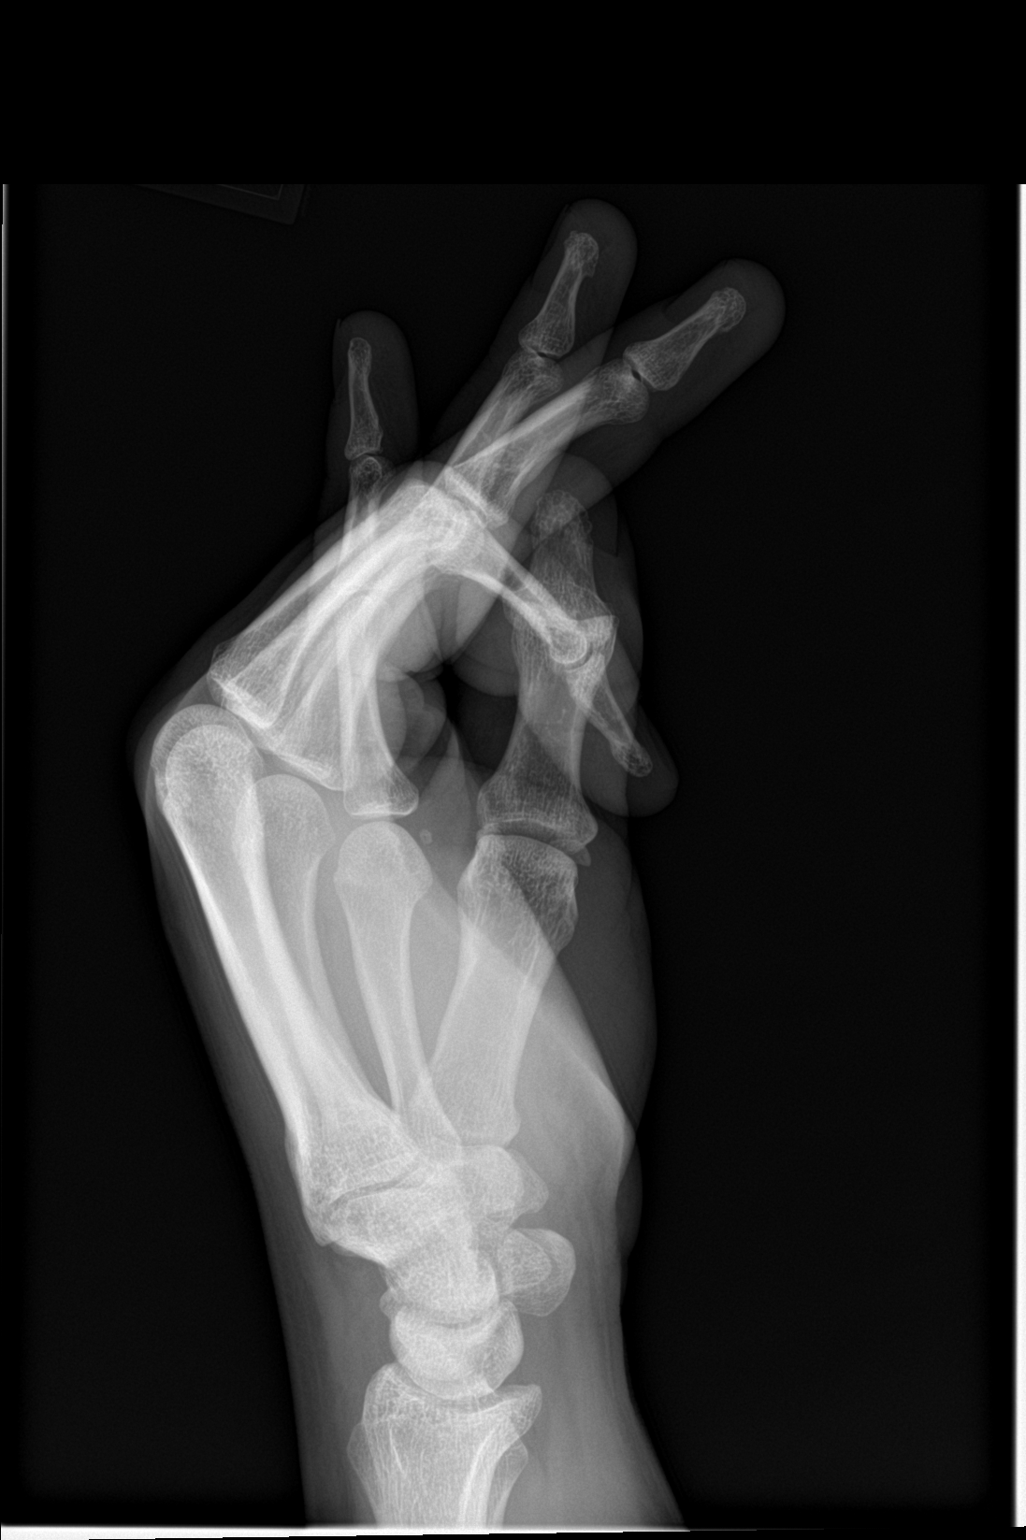

[3 of 3 positions shown; findings below may reference images not displayed]

FINDINGS: There is no evidence of fracture or dislocation. There is no
evidence of arthropathy or other focal bone abnormality. Soft
tissues are unremarkable.
IMPRESSION: Negative.

## 2019-06-26 ENCOUNTER — Emergency Department
Admission: EM | Admit: 2019-06-26 | Discharge: 2019-06-26 | Disposition: A | Discharge status: Home / Self Care | Payer: Self-pay | Attending: Emergency Medicine | Admitting: Emergency Medicine

## 2019-06-26 ENCOUNTER — Other Ambulatory Visit: Payer: Self-pay

## 2019-06-26 ENCOUNTER — Encounter: Payer: Self-pay | Admitting: Emergency Medicine

## 2019-06-26 DIAGNOSIS — Z79899 Other long term (current) drug therapy: Secondary | ICD-10-CM | POA: Insufficient documentation

## 2019-06-26 DIAGNOSIS — L237 Allergic contact dermatitis due to plants, except food: Secondary | ICD-10-CM | POA: Insufficient documentation

## 2019-06-26 DIAGNOSIS — F17210 Nicotine dependence, cigarettes, uncomplicated: Secondary | ICD-10-CM | POA: Insufficient documentation

## 2019-06-26 MED ORDER — TRIAMCINOLONE ACETONIDE 40 MG/ML IJ SUSP
40.0000 mg | Freq: Once | INTRAMUSCULAR | Status: AC
  Administered 2019-06-26: 40 mg via INTRAMUSCULAR
  Filled 2019-06-26: qty 1

## 2019-06-26 MED ORDER — LEVOCETIRIZINE DIHYDROCHLORIDE 5 MG PO TABS: 5.0000 mg | ORAL_TABLET | Freq: Every evening | ORAL | 0 refills | Status: DC

## 2019-06-26 MED ORDER — TRIAMCINOLONE ACETONIDE 0.1 % EX CREA: 1.0000 "application " | TOPICAL_CREAM | Freq: Four times a day (QID) | CUTANEOUS | 1 refills | Status: DC

## 2019-06-26 NOTE — ED Triage Notes (Signed)
Pt reports was in the woods on Friday and over the next few days he started developing a rash over his body and face. Pt with rash on face, swelling noted areound eyes. Pt denies SOB.

## 2019-06-26 NOTE — ED Notes (Signed)
See triage note  States he woke up with rash to arms and face  Poss poison ivy

## 2019-06-26 NOTE — ED Provider Notes (Signed)
[facility] Emergency Department Provider Note  ____________________________________________  Time seen: Approximately 2:32 PM  I have reviewed the triage vital signs and the nursing notes.   HISTORY  Chief Complaint Rash    HPI Eric [name] is a 33 y.o. male who presents to the ED with a complaint of pruritic rash to face and bilateral arms. [place] the [place] prior to onset of rash and feels as if he has poison ivy. No visual issues or shortness of breath. No other complaint.         Past Medical History:  Diagnosis Date  . Asthma   . COPD (chronic obstructive pulmonary disease) (HCC)     There are no active problems to display for this patient.   History reviewed. No pertinent surgical history.  Prior to Admission medications   Medication Sig Start Date End Date Taking? Authorizing Provider  dicyclomine (BENTYL) 20 MG tablet Take 1 tablet (20 mg total) by mouth 3 (three) times daily as needed (abdominal pain). 04/21/18   Nance Pear, MD  levocetirizine (XYZAL) 5 MG tablet Take 1 tablet (5 mg total) by mouth every evening. 06/26/19   Cuthriell, Charline Bills, PA-C  triamcinolone cream (KENALOG) 0.1 % Apply 1 application topically 4 (four) times daily. 06/26/19   Cuthriell, Charline Bills, PA-C    Allergies Patient has no known allergies.  No family history on file.  Social History Social History   Tobacco Use  . Smoking status: Current Every Day Smoker    Packs/day: 1.00    Types: Cigarettes  . Smokeless tobacco: Never Used  Substance Use Topics  . Alcohol use: No  . Drug use: Yes    Types: Marijuana     Review of Systems  Constitutional: No fever/chills Eyes: No visual changes. No discharge ENT: No upper respiratory complaints. Cardiovascular: no chest pain. Respiratory: no cough. No SOB. Gastrointestinal: No abdominal pain.  No nausea, no vomiting.  No diarrhea.  No constipation. Musculoskeletal: Negative for  musculoskeletal pain. Skin: Rash to face and arms Neurological: Negative for headaches, focal weakness or numbness. 10-point ROS otherwise negative.  ____________________________________________   PHYSICAL EXAM:  VITAL SIGNS: ED Triage Vitals  Enc Vitals Group     BP 06/26/19 1316 122/86     Pulse Rate 06/26/19 1316 84     Resp 06/26/19 1316 20     Temp 06/26/19 1316 98.8 F (37.1 C)     Temp Source 06/26/19 1316 Oral     SpO2 06/26/19 1316 100 %     Weight 06/26/19 1314 191 lb (86.6 kg)     Height 06/26/19 1314 5\' 5"  (1.651 m)     Head Circumference --      Peak Flow --      Pain Score 06/26/19 1314 0     Pain Loc --      Pain Edu? --      Excl. in [place]? --      Constitutional: Alert and oriented. Well appearing and in no acute distress. Eyes: Conjunctivae are normal. PERRL. EOMI. Head: Atraumatic. ENT:      Ears:       Nose: No congestion/rhinnorhea.      Mouth/Throat: Mucous membranes are moist.  Neck: No stridor.    Cardiovascular: Normal rate, regular rhythm. Normal S1 and S2.  Good peripheral circulation. Respiratory: Normal respiratory effort without tachypnea or retractions. Lungs CTAB. Good air entry to the bases with no decreased or absent breath sounds. Musculoskeletal: Full  range of motion to all extremities. No gross deformities appreciated. Neurologic:  Normal speech and language. No gross focal neurologic deficits are appreciated.  Skin:  Skin is warm, dry and intact. No rash noted. Maculopapular rash consistent with contact dermatitis noted to face and bilateral upper extremities. Mild oozing noted at arms, none on face. No angioedema Psychiatric: Mood and affect are normal. Speech and behavior are normal. Patient exhibits appropriate insight and judgement.   ____________________________________________   LABS (all labs ordered are listed, but only abnormal results are displayed)  Labs Reviewed - No data to  display ____________________________________________  EKG   ____________________________________________  RADIOLOGY   No results found.  ____________________________________________    PROCEDURES  Procedure(s) performed:    Procedures    Medications  triamcinolone acetonide (KENALOG-40) injection 40 mg (40 mg Intramuscular Given 06/26/19 1446)     ____________________________________________   INITIAL IMPRESSION / ASSESSMENT AND PLAN / ED COURSE  Pertinent labs & imaging results that were available during my care of the patient were reviewed by me and considered in my medical decision making (see chart for details).  Review of the [place] CSRS was performed in accordance of the [facility] prior to dispensing any controlled drugs.           Patient's diagnosis is consistent with contact dermatitis. Patient with contact with likely poison ivy. Rash involved arms, face. No angioedema. No respiratory issues. Patient is given IM kenalog and will be prescribed triamcinalone and xyzal. Follow-up with primary care as needed. Return precautions are discussed with patient.  Patient is given ED precautions to return to the ED for any worsening or new symptoms.     ____________________________________________  FINAL CLINICAL IMPRESSION(S) / ED DIAGNOSES  Final diagnoses:  Poison ivy dermatitis      NEW MEDICATIONS STARTED DURING THIS VISIT:  ED Discharge Orders         Ordered    levocetirizine (XYZAL) 5 MG tablet  Every evening     06/26/19 1439    triamcinolone cream (KENALOG) 0.1 %  4 times daily     06/26/19 1439              This chart was dictated using voice recognition software/Dragon. Despite Savin efforts to proofread, errors can occur which can change the meaning. Any change was purely unintentional.    Racheal Patches, PA-C 06/26/19 1447    Emily Filbert, MD 06/26/19 1534

## 2020-04-29 ENCOUNTER — Emergency Department: Payer: Self-pay

## 2020-04-29 ENCOUNTER — Emergency Department
Admission: EM | Admit: 2020-04-29 | Discharge: 2020-04-29 | Disposition: A | Discharge status: Home / Self Care | Payer: Self-pay | Attending: Emergency Medicine | Admitting: Emergency Medicine

## 2020-04-29 ENCOUNTER — Encounter: Payer: Self-pay | Admitting: Emergency Medicine

## 2020-04-29 ENCOUNTER — Other Ambulatory Visit: Payer: Self-pay

## 2020-04-29 DIAGNOSIS — F1590 Other stimulant use, unspecified, uncomplicated: Secondary | ICD-10-CM | POA: Insufficient documentation

## 2020-04-29 DIAGNOSIS — R079 Chest pain, unspecified: Secondary | ICD-10-CM | POA: Insufficient documentation

## 2020-04-29 DIAGNOSIS — R0789 Other chest pain: Secondary | ICD-10-CM

## 2020-04-29 DIAGNOSIS — Z79899 Other long term (current) drug therapy: Secondary | ICD-10-CM | POA: Insufficient documentation

## 2020-04-29 DIAGNOSIS — M791 Myalgia, unspecified site: Secondary | ICD-10-CM | POA: Insufficient documentation

## 2020-04-29 DIAGNOSIS — M79602 Pain in left arm: Secondary | ICD-10-CM | POA: Insufficient documentation

## 2020-04-29 DIAGNOSIS — J45909 Unspecified asthma, uncomplicated: Secondary | ICD-10-CM | POA: Insufficient documentation

## 2020-04-29 DIAGNOSIS — F17210 Nicotine dependence, cigarettes, uncomplicated: Secondary | ICD-10-CM | POA: Insufficient documentation

## 2020-04-29 DIAGNOSIS — J449 Chronic obstructive pulmonary disease, unspecified: Secondary | ICD-10-CM | POA: Insufficient documentation

## 2020-04-29 LAB — BASIC METABOLIC PANEL
Anion gap: 10 (ref 5–15)
BUN: 12 mg/dL (ref 6–20)
CO2: 21 mmol/L — ABNORMAL LOW (ref 22–32)
Calcium: 9.1 mg/dL (ref 8.9–10.3)
Chloride: 107 mmol/L (ref 98–111)
Creatinine, Ser: 0.69 mg/dL (ref 0.61–1.24)
GFR calc Af Amer: >60 mL/min (ref >60)
GFR calc non Af Amer: >60 mL/min (ref >60)
Glucose, Bld: 108 mg/dL — ABNORMAL HIGH (ref 70–99)
Potassium: 3.9 mmol/L (ref 3.5–5.1)
Sodium: 138 mmol/L (ref 135–145)

## 2020-04-29 LAB — CBC
HCT: 43 % (ref 39.0–52.0)
Hemoglobin: 15.6 g/dL (ref 13.0–17.0)
MCH: 33.3 pg (ref 26.0–34.0)
MCHC: 36.3 g/dL — ABNORMAL HIGH (ref 30.0–36.0)
MCV: 91.7 fL (ref 80.0–100.0)
Platelets: 295 10*3/uL (ref 150–400)
RBC: 4.69 MIL/uL (ref 4.22–5.81)
RDW: 11.2 % — ABNORMAL LOW (ref 11.5–15.5)
WBC: 9.5 10*3/uL (ref 4.0–10.5)
nRBC: 0 % (ref 0.0–0.2)

## 2020-04-29 LAB — TROPONIN I (HIGH SENSITIVITY): Troponin I (High Sensitivity): 4 ng/L (ref <18)

## 2020-04-29 MED ORDER — ACETAMINOPHEN 500 MG PO TABS
1000.0000 mg | ORAL_TABLET | Freq: Once | ORAL | Status: AC
  Administered 2020-04-29: 1000 mg via ORAL
  Filled 2020-04-29: qty 2

## 2020-04-29 MED ORDER — NAPROXEN 500 MG PO TABS
500.0000 mg | ORAL_TABLET | Freq: Once | ORAL | Status: AC
  Administered 2020-04-29: 500 mg via ORAL
  Filled 2020-04-29: qty 1

## 2020-04-29 NOTE — ED Triage Notes (Signed)
Patient presents to the ED with left sided chest pain that he describes as "stiffness/tightness" and left arm pain that woke him up out of his sleep at 5am.  Patient states he has had similar pain before.  Denies history of heart problems or heart attacks.

## 2020-04-29 NOTE — ED Provider Notes (Signed)
[facility] Emergency Department Provider Note  ____________________________________________   First MD Initiated Contact with Patient 04/29/20 1546     (approximate)  I have reviewed the triage vital signs and the nursing notes.   HISTORY  Chief Complaint Chest Pain   HPI Eric [name] is a 34 y.o. male with a past medical history of COPD, tobacco abuse, and asthma who presents for assessment of left-sided chest pain rating down his left arm that describes as tightness or stiffness that began around 5 AM when he woke from sleep.  This is similar to prior episodes he has had in the past.  No clear alleviating or aggravating factors although patient does do some heavy lifting at work.  He denies any fevers, chills, headache, earache, sore throat, cough, right-sided chest pain, back pain, abdominal pain, nausea, vomiting, diarrhea, dysuria, diaphoresis, or other acute complaints.  Denies EtOH or illicit drug use.  Denies any known history of CAD, DM, HDL, HTN, or other significant past medical history no other acute concerns or complaints at this time.         Past Medical History:  Diagnosis Date  . Asthma   . COPD (chronic obstructive pulmonary disease) (HCC)     There are no problems to display for this patient.   History reviewed. No pertinent surgical history.  Prior to Admission medications   Medication Sig Start Date End Date Taking? Authorizing Provider  dicyclomine (BENTYL) 20 MG tablet Take 1 tablet (20 mg total) by mouth 3 (three) times daily as needed (abdominal pain). 04/21/18   Phineas Semen, MD  levocetirizine (XYZAL) 5 MG tablet Take 1 tablet (5 mg total) by mouth every evening. 06/26/19   Cuthriell, Delorise Royals, PA-C  triamcinolone cream (KENALOG) 0.1 % Apply 1 application topically 4 (four) times daily. 06/26/19   Cuthriell, Delorise Royals, PA-C    Allergies Patient has no known allergies.  No family history on file.  Social  History Social History   Tobacco Use  . Smoking status: Current Every Day Smoker    Packs/day: 1.00    Types: Cigarettes  . Smokeless tobacco: Never Used  Substance Use Topics  . Alcohol use: No  . Drug use: Yes    Types: Marijuana    Review of Systems  Review of Systems  Constitutional: Negative for chills and fever.  HENT: Negative for sore throat.   Eyes: Negative for pain.  Respiratory: Negative for cough and stridor.   Cardiovascular: Positive for chest pain.  Gastrointestinal: Negative for vomiting.  Musculoskeletal: Positive for myalgias ( L chest).  Skin: Negative for rash.  Neurological: Negative for seizures, loss of consciousness and headaches.  Psychiatric/Behavioral: Negative for suicidal ideas.  All other systems reviewed and are negative.     ____________________________________________   PHYSICAL EXAM:  VITAL SIGNS: ED Triage Vitals  Enc Vitals Group     BP 04/29/20 1155 (!) 147/96     Pulse Rate 04/29/20 1155 88     Resp 04/29/20 1155 18     Temp 04/29/20 1155 98.6 F (37 C)     Temp Source 04/29/20 1155 Oral     SpO2 04/29/20 1155 99 %     Weight 04/29/20 1212 184 lb (83.5 kg)     Height 04/29/20 1212 5\' 6"  (1.676 m)     Head Circumference --      Peak Flow --      Pain Score --      Pain Loc --  Pain Edu? --      Excl. in [place]? --    Vitals:   04/29/20 1155  BP: (!) 147/96  Pulse: 88  Resp: 18  Temp: 98.6 F (37 C)  SpO2: 99%   Physical Exam Vitals and nursing note reviewed.  Constitutional:      Appearance: He is well-developed.  HENT:     Head: Normocephalic and atraumatic.     Right Ear: External ear normal.     Left Ear: External ear normal.     Nose: Nose normal.     Mouth/Throat:     Mouth: Mucous membranes are moist.  Eyes:     Conjunctiva/sclera: Conjunctivae normal.  Cardiovascular:     Rate and Rhythm: Normal rate and regular rhythm.     Heart sounds: No murmur heard.   Pulmonary:     Effort: Pulmonary  effort is normal. No respiratory distress.     Breath sounds: Normal breath sounds.  Abdominal:     Palpations: Abdomen is soft.     Tenderness: There is no abdominal tenderness.  Musculoskeletal:        General: Tenderness present.     Cervical back: Neck supple.  Skin:    General: Skin is warm and dry.     Capillary Refill: Capillary refill takes less than 2 seconds.  Neurological:     Mental Status: He is alert and oriented to person, place, and time.  Psychiatric:        Mood and Affect: Mood normal.   Patient is exquisitely tender over his left costochondral joints and left pectoralis muscle.  His left upper extremities unremarkable.  2+ bilateral radial pulses.  Sensation intact to light touch throughout both upper extremities.  Patient is full information of his bilateral upper extremities. ____________________________________________   LABS (all labs ordered are listed, but only abnormal results are displayed)  Labs Reviewed  BASIC METABOLIC PANEL - Abnormal; Notable for the following components:      Result Value   CO2 21 (*)    Glucose, Bld 108 (*)    All other components within normal limits  CBC - Abnormal; Notable for the following components:   MCHC 36.3 (*)    RDW 11.2 (*)    All other components within normal limits  TROPONIN I (HIGH SENSITIVITY)  TROPONIN I (HIGH SENSITIVITY)   ____________________________________________  EKG  Sinus rhythm with ventricular rate of 83, normal axis, unremarkable intervals, no evidence of acute ischemia. ____________________________________________  RADIOLOGY  ED MD interpretation: No evidence of pneumonia, pneumothorax, rib fracture, or other acute thoracic process.  Official radiology report(s): DG Chest 2 View  Result Date: 04/29/2020 CLINICAL DATA:  Chest pain EXAM: CHEST - 2 VIEW COMPARISON:  10/02/2017 FINDINGS: Heart size upper normal. Negative for heart failure. Lungs are well aerated and clear without  infiltrate or effusion. No acute skeletal abnormality. IMPRESSION: No active cardiopulmonary disease. Electronically Signed   By: Marlan Palau M.D.   On: 04/29/2020 12:46    ____________________________________________   PROCEDURES  Procedure(s) performed (including Critical Care):  Procedures   ____________________________________________   INITIAL IMPRESSION / ASSESSMENT AND PLAN / ED COURSE        Patient presents above to history exam for assessment of left-sided chest pain that began today.  Patient is slightly hypertensive otherwise stable vital signs on room air.  Exam as above remarkable for exquisite tenderness over the left costochondral joints as well as left pectoralis muscles without evidence of overlying skin changes  and unremarkable exam of the left upper extremity.  Chest x-ray obtained shows no evidence of pneumonia, rib fracture, or other acute thoracic process.  Low suspicion for ACS given reassuring EKG with nonelevated troponin obtained greater than 6 hours after symptom onset.  CBC and CMP are unremarkable and show no evidence of significant electrolyte or metabolic derangement.  No evidence of acute infectious process on exam.  Low suspicion for PE at this time.  Presentation, absence of white mediastinum on chest x-ray, and normal bilateral upper extremity pulses are not consistent with dissection.  Impression is likely costochondritis with possible musculoskeletal strain of the pectoralis muscles.  Patient given below noted analgesia.  Counseled patient on decreasing and eventual cessation of tobacco use.  Discharged stable condition.  Strict return precautions advised and discussed.    ____________________________________________   FINAL CLINICAL IMPRESSION(S) / ED DIAGNOSES  Final diagnoses:  Chest wall pain    Medications  acetaminophen (TYLENOL) tablet 1,000 mg (has no administration in time range)  naproxen (NAPROSYN) tablet 500 mg (has no  administration in time range)     ED Discharge Orders    None       Note:  This document was prepared using Dragon voice recognition software and may include unintentional dictation errors.   Gilles Chiquito, MD 04/29/20 1600

## 2020-10-17 ENCOUNTER — Other Ambulatory Visit: Payer: Self-pay

## 2020-10-17 ENCOUNTER — Encounter: Payer: Self-pay | Admitting: Emergency Medicine

## 2020-10-17 ENCOUNTER — Emergency Department
Admission: EM | Admit: 2020-10-17 | Discharge: 2020-10-17 | Disposition: A | Discharge status: Home / Self Care | Payer: Self-pay | Attending: Emergency Medicine | Admitting: Emergency Medicine

## 2020-10-17 DIAGNOSIS — J449 Chronic obstructive pulmonary disease, unspecified: Secondary | ICD-10-CM | POA: Insufficient documentation

## 2020-10-17 DIAGNOSIS — K029 Dental caries, unspecified: Secondary | ICD-10-CM | POA: Insufficient documentation

## 2020-10-17 DIAGNOSIS — F17210 Nicotine dependence, cigarettes, uncomplicated: Secondary | ICD-10-CM | POA: Insufficient documentation

## 2020-10-17 DIAGNOSIS — H9202 Otalgia, left ear: Secondary | ICD-10-CM | POA: Insufficient documentation

## 2020-10-17 DIAGNOSIS — J45909 Unspecified asthma, uncomplicated: Secondary | ICD-10-CM | POA: Insufficient documentation

## 2020-10-17 MED ORDER — LIDOCAINE VISCOUS HCL 2 % MT SOLN
15.0000 mL | Freq: Once | OROMUCOSAL | Status: AC
  Administered 2020-10-17: 15 mL via OROMUCOSAL
  Filled 2020-10-17: qty 15

## 2020-10-17 MED ORDER — AMOXICILLIN-POT CLAVULANATE 875-125 MG PO TABS: 1.0000 | ORAL_TABLET | Freq: Two times a day (BID) | ORAL | 0 refills | Status: AC

## 2020-10-17 MED ORDER — LIDOCAINE VISCOUS HCL 2 % MT SOLN: 15.0000 mL | OROMUCOSAL | 0 refills | Status: AC | PRN

## 2020-10-17 MED ORDER — IBUPROFEN 800 MG PO TABS
800.0000 mg | ORAL_TABLET | Freq: Once | ORAL | Status: AC
  Administered 2020-10-17: 800 mg via ORAL
  Filled 2020-10-17: qty 1

## 2020-10-17 MED ORDER — AMOXICILLIN-POT CLAVULANATE 875-125 MG PO TABS
1.0000 | ORAL_TABLET | Freq: Once | ORAL | Status: AC
  Administered 2020-10-17: 1 via ORAL
  Filled 2020-10-17: qty 1

## 2020-10-17 NOTE — ED Triage Notes (Signed)
Pt to ED via POV with c/o L sided dental pain that started yesterday. Pt A&O x4, NAD noted at this time.

## 2020-10-17 NOTE — ED Provider Notes (Signed)
[facility] Emergency Department Provider Note  ____________________________________________   Event Date/Time   First MD Initiated Contact with Patient 10/17/20 1552     (approximate)  I have reviewed the triage vital signs and the nursing notes.   HISTORY  Chief Complaint Dental Pain  HPI ELDRICK [name] is a 35 y.o. male who presents to the emergency department today for evaluation of dental pain of the left upper [name] and associated ear pain.  Patient states that he has had a broken tooth there for quite a while, as well as noted dental cavities.  He has not seen a dentist in quite some time.  Patient denies any recent trauma or change, just notes that tooth pain began yesterday.  He denies any pain under the tongue, no throat swelling, no fevers or other systemic symptoms.         Past Medical History:  Diagnosis Date  . Asthma   . COPD (chronic obstructive pulmonary disease) (HCC)     There are no problems to display for this patient.   History reviewed. No pertinent surgical history.  Prior to Admission medications   Medication Sig Start Date End Date Taking? Authorizing Provider  amoxicillin-clavulanate (AUGMENTIN) 875-125 MG tablet Take 1 tablet by mouth every 12 (twelve) hours for 10 days. 10/17/20 10/27/20 Yes , Ruben Gottron, PA  lidocaine (XYLOCAINE) 2 % solution Use as directed 15 mLs in the mouth or throat as needed for mouth pain. 10/17/20  Yes Lucy Chris, PA    Allergies Patient has no known allergies.  History reviewed. No pertinent family history.  Social History Social History   Tobacco Use  . Smoking status: Current Every Day Smoker    Packs/day: 1.00    Types: Cigarettes  . Smokeless tobacco: Never Used  Substance Use Topics  . Alcohol use: No  . Drug use: Yes    Types: Marijuana    Review of Systems Constitutional: No fever/chills Eyes: No visual changes. ENT: + Dental pain, no sore  throat. Cardiovascular: Denies chest pain. Respiratory: Denies shortness of breath. Gastrointestinal: No abdominal pain.  No nausea, no vomiting.  No diarrhea.  No constipation. Genitourinary: Negative for dysuria. Musculoskeletal: Negative for back pain. Skin: Negative for rash. Neurological: Negative for headaches, focal weakness or numbness.  ____________________________________________   PHYSICAL EXAM:  VITAL SIGNS: ED Triage Vitals  Enc Vitals Group     BP 10/17/20 1544 (!) 138/94     Pulse Rate 10/17/20 1544 85     Resp 10/17/20 1544 17     Temp 10/17/20 1544 98.4 F (36.9 C)     Temp Source 10/17/20 1544 Oral     SpO2 10/17/20 1544 98 %     Weight 10/17/20 1544 185 lb (83.9 kg)     Height 10/17/20 1544 5\' 5"  (1.651 m)     Head Circumference --      Peak Flow --      Pain Score 10/17/20 1543 10     Pain Loc --      Pain Edu? --      Excl. in [place]? --    Constitutional: Alert and oriented. Well appearing and in no acute distress. Eyes: Conjunctivae are normal. PERRL. EOMI. Head: Atraumatic. Nose: No congestion/rhinnorhea. Mouth/Throat: There are multiple dental caries and broken teeth noted throughout the oral cavity.  Specifically of the left upper jawline, there is no identified periapical abscess.  No overlying soft tissue swelling or erythema noted. Ears: The  bilateral TMs are visualized and nonerythematous, nonbulging. Neck: No stridor.   Cardiovascular: Normal rate, regular rhythm. Grossly normal heart sounds.  Good peripheral circulation. Respiratory: Normal respiratory effort.  No retractions. Lungs CTAB. Musculoskeletal: No lower extremity tenderness nor edema.  No joint effusions. Neurologic:  Normal speech and language. No gross focal neurologic deficits are appreciated. No gait instability. Skin:  Skin is warm, dry and intact. No rash noted. Psychiatric: Mood and affect are normal. Speech and behavior are  normal.   ____________________________________________   INITIAL IMPRESSION / ASSESSMENT AND PLAN / ED COURSE  As part of my medical decision making, I reviewed the following data within the electronic MEDICAL RECORD NUMBER Nursing notes reviewed and incorporated and Notes from prior ED visits        Patient is a 35 year old male who presents to the emergency department for evaluation of left upper sided dental pain that began yesterday.  See HPI for further details.  In triage, the patient is mildly hypertensive but otherwise has normal vital signs.  On physical exam, there are multiple broken teeth and dental caries noted throughout the oral cavity, but no particular abscess noted of the left upper jawline.  Suspect that the patient's pain may be related to early infection given that his broken tooth is not new.  Will initiate antibiotics with Augmentin, will encourage high-dose anti-inflammatory as well as topical viscous lidocaine for symptom management.  Patient is amenable with this plan.  Return precautions were discussed.  He is appropriate at this time for outpatient follow-up.  He was given a list of dental follow-up options.      ____________________________________________   FINAL CLINICAL IMPRESSION(S) / ED DIAGNOSES  Final diagnoses:  Pain due to dental caries     ED Discharge Orders         Ordered    amoxicillin-clavulanate (AUGMENTIN) 875-125 MG tablet  Every 12 hours        10/17/20 1643    lidocaine (XYLOCAINE) 2 % solution  As needed        10/17/20 1643          *Please note:  SHAUGHN THOMLEY was evaluated in Emergency Department on 10/17/2020 for the symptoms described in the history of present illness. He was evaluated in the context of the global COVID-19 pandemic, which necessitated consideration that the patient might be at risk for infection with the SARS-CoV-2 virus that causes COVID-19. Institutional protocols and algorithms that pertain to the evaluation of  patients at risk for COVID-19 are in a state of rapid change based on information released by regulatory bodies including the CDC and federal and state organizations. These policies and algorithms were followed during the patient's care in the ED.  Some ED evaluations and interventions may be delayed as a result of limited staffing during and the pandemic.*   Note:  This document was prepared using Dragon voice recognition software and may include unintentional dictation errors.   Lucy Chris, PA 10/17/20 2013    Concha Se, MD 10/18/20 2250

## 2020-10-17 NOTE — Discharge Instructions (Addendum)
OPTIONS FOR DENTAL FOLLOW UP CARE ° °[place] Department of Health and Human Services - Local Safety Net Dental Clinics °http://www.ncdhhs.gov/dph/oralhealth/services/safetynetclinics.htm °  °[facility] (336-562-3123) ° °[facility] (919-933-9087) ° °[facility] (919-663-1744 ext [phone]) ° °[facility] ([phone]) ° °[facility] ([phone]) °This clinic caters to the indigent population and is on a lottery system. °Location: °[facility], [address], [place] °Clinic Hours: °Wednesdays from 6pm - 9pm, patients seen by a lottery system. °For dates, call or go to [email or web address] °Services: °Cleanings, fillings and simple extractions. °Payment Options: °DENTAL WORK IS FREE OF CHARGE. Bring proof of income or support. °[address] way to get seen: °Arrive at 5:15 pm - this is a lottery, NOT first come/first serve, so arriving earlier will not increase your chances of being seen. °  °  °[facility] °[phone] °Select option 1 for emergencies °  °Location: °[facility], [address], [place] °Clinic Hours: °No walk-ins accepted - call the day before to schedule an appointment. °Check in times are 9:30 am and 1:30 pm. °Services: °Simple extractions, temporary fillings, pulpectomy/pulp debridement, uncomplicated abscess drainage. °Payment Options: °PAYMENT IS DUE AT THE TIME OF SERVICE.  Fee is usually $100-200, additional surgical procedures (e.g. abscess drainage) may be extra. °Cash, checks, Visa/MasterCard accepted.  Can file Medicaid if patient is covered for dental - patient should call case worker to check. °No discount for [facility] patients. °[address] to get seen: °MUST call the day before and get onto the schedule. Can usually be seen the next 1-2 days. No walk-ins accepted. °  °  °[facility] °[phone] °   °Location: °[facility], [address], [place] °Clinic Hours: °M, W, Th, F 8am or 1:30pm, Tues 9a or 1:30 - first come/first served. °Services: °Simple extractions, temporary fillings, uncomplicated abscess drainage.  You do not need to be an [place] resident. °Payment Options: °PAYMENT IS DUE AT THE TIME OF SERVICE. °Dental insurance, otherwise sliding scale - bring proof of income or support. °Depending on income and treatment needed, cost is usually $50-200. °[address] to get seen: °Arrive early as it is first come/first served. °  °  °[facility] °[phone] °  °Location: °[address] °Clinic Hours: °Mon-Thu 8a-5p °Services: °Most basic dental services including extractions and fillings. °Payment Options: °PAYMENT IS DUE AT THE TIME OF SERVICE. °Sliding scale, up to 50% off - bring proof if income or support. °Medicaid with dental option accepted. °[address] to get seen: °Call to schedule an appointment, can usually be seen within 2 weeks OR they will try to see walk-ins - show up at 8a or 2p (you may have to wait). °  °  °[facility] °[phone] °[place] RESIDENTS ONLY °  °Location: °[facility], [address], [place], [place] [address] °Clinic Hours: By appointment only. °Monday - Thursday 8am-5pm, Friday 8am-12pm °Services: Cleanings, fillings, extractions. °Payment Options: °PAYMENT IS DUE AT THE TIME OF SERVICE. °Cash, Visa or MasterCard. Sliding scale - $30 minimum per service. °[address] to get seen: °Come in to office, complete packet and make an appointment - need proof of income °or support monies for each household member and proof of [place] residence. °Usually takes about a month to get in. °  °  °[facility] °[phone] °  °Location: °[address]   [place] °Clinic Hours: Walk-in Urgent Care Dental Services are offered Monday-Friday  mornings only. °The numbers of emergencies accepted daily is limited to the number of °providers available. °Maximum 15 - Mondays, Wednesdays & Thursdays °Maximum 10 - Tuesdays & Fridays °Services: °You do not need to be a [place] resident to be seen for a dental emergency. °Emergencies are defined as pain, swelling, abnormal bleeding, or dental trauma. Walkins will receive x-rays if needed. °NOTE: Dental cleaning is not an emergency. °Payment Options: °PAYMENT IS DUE AT THE TIME OF SERVICE. °Minimum co-pay is $40.00 for uninsured patients. °Minimum co-pay is $3.00 for Medicaid with dental coverage. °Dental Insurance is accepted and must be presented at time of visit. °Medicare does not cover dental. °Forms of payment: Cash, credit card, checks. °[address] to get seen: °If not previously registered with the clinic, walk-in dental registration begins at 7:15 am and is on a first come/first serve basis. °If previously registered with the clinic, call to make an appointment. °  °  °[facility] °[phone] °[place] RESIDENTS ONLY °  °Location: °[address], [place],  °Clinic Hours: °Mon-Thu 10a-2p °Services: Extractions only! °Payment Options: °FREE (donations accepted) - bring proof of income or support °[address] to get seen: °Call and schedule an appointment OR come at 8am on the 1st Monday of every month (except for holidays) when it is first come/first served. °  °  °[facility] °[phone] °  °Location: °[address], [place] °Clinic Hours: °Friday mornings °Services, Payment Options, [address] way to get seen: °Call for info °

## 2020-10-17 NOTE — ED Triage Notes (Signed)
Pt comes into the ED via POV c/o left side dental pain on upper and lower side.  Pt in NAD at this time.

## 2020-10-17 NOTE — ED Notes (Signed)
See triage note  Presents with dental pain states he has 2 broken teeth on left side     Upper and lower   Min swelling  States his teeth have been broke for awhile but has not seen a dentist

## 2022-05-25 ENCOUNTER — Emergency Department: Payer: Self-pay

## 2022-05-25 ENCOUNTER — Other Ambulatory Visit: Payer: Self-pay

## 2022-05-25 DIAGNOSIS — J449 Chronic obstructive pulmonary disease, unspecified: Secondary | ICD-10-CM | POA: Insufficient documentation

## 2022-05-25 DIAGNOSIS — J45909 Unspecified asthma, uncomplicated: Secondary | ICD-10-CM | POA: Insufficient documentation

## 2022-05-25 DIAGNOSIS — G8929 Other chronic pain: Secondary | ICD-10-CM | POA: Insufficient documentation

## 2022-05-25 DIAGNOSIS — F17200 Nicotine dependence, unspecified, uncomplicated: Secondary | ICD-10-CM | POA: Insufficient documentation

## 2022-05-25 DIAGNOSIS — R0789 Other chest pain: Secondary | ICD-10-CM | POA: Insufficient documentation

## 2022-05-25 LAB — BASIC METABOLIC PANEL
Anion gap: 10 (ref 5–15)
BUN: 10 mg/dL (ref 6–20)
CO2: 22 mmol/L (ref 22–32)
Calcium: 8.7 mg/dL — ABNORMAL LOW (ref 8.9–10.3)
Chloride: 106 mmol/L (ref 98–111)
Creatinine, Ser: 0.97 mg/dL (ref 0.61–1.24)
GFR, Estimated: >60 mL/min (ref >60)
Glucose, Bld: 91 mg/dL (ref 70–99)
Potassium: 3.5 mmol/L (ref 3.5–5.1)
Sodium: 138 mmol/L (ref 135–145)

## 2022-05-25 LAB — CBC
HCT: 44.3 % (ref 39.0–52.0)
Hemoglobin: 15.8 g/dL (ref 13.0–17.0)
MCH: 33.1 pg (ref 26.0–34.0)
MCHC: 35.7 g/dL (ref 30.0–36.0)
MCV: 92.7 fL (ref 80.0–100.0)
Platelets: 378 10*3/uL (ref 150–400)
RBC: 4.78 MIL/uL (ref 4.22–5.81)
RDW: 11.1 % — ABNORMAL LOW (ref 11.5–15.5)
WBC: 8.6 10*3/uL (ref 4.0–10.5)
nRBC: 0 % (ref 0.0–0.2)

## 2022-05-25 LAB — TROPONIN I (HIGH SENSITIVITY): Troponin I (High Sensitivity): 4 ng/L (ref <18)

## 2022-05-25 NOTE — ED Triage Notes (Signed)
Pt presents via EMS c/o chest pain. Reports chest pain is to left side of chest. Pt stated he has had chest pain for years. Reports chest pain has improved. Also reports intermittent abd swelling. When asked does he have any medical problems, pt responds "only mental problems". Denies SI/HI.

## 2022-05-26 ENCOUNTER — Emergency Department
Admission: EM | Admit: 2022-05-26 | Discharge: 2022-05-26 | Disposition: A | Discharge status: Home / Self Care | Payer: Self-pay | Attending: Emergency Medicine | Admitting: Emergency Medicine

## 2022-05-26 DIAGNOSIS — G8929 Other chronic pain: Secondary | ICD-10-CM

## 2022-05-26 LAB — TROPONIN I (HIGH SENSITIVITY): Troponin I (High Sensitivity): 4 ng/L (ref <18)

## 2022-05-26 MED ORDER — IBUPROFEN 800 MG PO TABS
800.0000 mg | ORAL_TABLET | Freq: Once | ORAL | Status: AC
  Administered 2022-05-26: 800 mg via ORAL
  Filled 2022-05-26: qty 1

## 2022-05-26 NOTE — ED Notes (Signed)
Signing pad did not work, pt verbalized understanding of DC instructions. 

## 2022-05-26 NOTE — Discharge Instructions (Addendum)
You may alternate Tylenol 1000 mg every 6 hours as needed for pain, fever and Ibuprofen 800 mg every 6-8 hours as needed for pain, fever.  Please take Ibuprofen with food.  Do not take more than 4000 mg of Tylenol (acetaminophen) in a 24 hour period.  Steps to find a Primary Care Provider (PCP):  Call [phone] or [phone] to access "[facility] Find a Doctor Service."  2.  You may also go on the [place] [facility] website at [email or web address]  

## 2022-05-26 NOTE — ED Notes (Signed)
Pt brought to ED Hallway 12 at this time, this RN now assuming care.

## 2022-05-26 NOTE — ED Provider Notes (Signed)
[facility] Provider Note    Event Date/Time   First MD Initiated Contact with Patient 05/26/22 [phone]     (approximate)   History   Chest Pain   HPI  Eric [name] is a 36 y.o. male with COPD, tobacco use who presents to the emergency department with anterior chest pain and shortness of breath and cramping in his hands that has been ongoing for "years".  No change in his symptoms today other than he states "I could not take it anymore".  Denies any known aggravating or alleviating factors.  No history of PE, DVT, exogenous estrogen use, recent fractures, surgery, trauma, hospitalization, prolonged travel or other immobilization. No lower extremity swelling or pain. No calf tenderness.  No fever or cough.  Does not have a PCP or cardiologist.  Has never seen anyone other than the ED for the symptoms.   History provided by patient.    Past Medical History:  Diagnosis Date   Asthma    COPD (chronic obstructive pulmonary disease) ([place])     History reviewed. No pertinent surgical history.  MEDICATIONS:  Prior to Admission medications   Medication Sig Start Date End Date Taking? Authorizing Provider  lidocaine (XYLOCAINE) 2 % solution Use as directed 15 mLs in the mouth or throat as needed for mouth pain. 10/17/20   Marlana Salvage, PA    Physical Exam   Triage Vital Signs: ED Triage Vitals  Enc Vitals Group     BP 05/25/22 2132 (!) 134/101     Pulse Rate 05/25/22 2132 87     Resp 05/25/22 2132 14     Temp 05/25/22 2132 98.4 F (36.9 C)     Temp Source 05/25/22 2132 Oral     SpO2 05/25/22 2132 91 %     Weight 05/25/22 2133 187 lb (84.8 kg)     Height 05/25/22 2133 5\' 5"  (1.651 m)     Head Circumference --      Peak Flow --      Pain Score 05/25/22 2132 4     Pain Loc --      Pain Edu? --      Excl. in [place]? --     Most recent vital signs: Vitals:   05/25/22 2132 05/26/22 0131  BP: (!) 134/101 (!) 123/93  Pulse: 87 71  Resp: 14 16   Temp: 98.4 F (36.9 C) 97.8 F (36.6 C)  SpO2: 91% 99%    CONSTITUTIONAL: Alert and oriented and responds appropriately to questions. Well-appearing; well-nourished HEAD: Normocephalic, atraumatic EYES: Conjunctivae clear, pupils appear equal, sclera nonicteric ENT: normal nose; moist mucous membranes NECK: Supple, normal ROM CARD: RRR; S1 and S2 appreciated; no murmurs, no clicks, no rubs, no gallops RESP: Normal chest excursion without splinting or tachypnea; breath sounds clear and equal bilaterally; no wheezes, no rhonchi, no rales, no hypoxia or respiratory distress, speaking full sentences ABD/GI: Normal bowel sounds; non-distended; soft, non-tender, no rebound, no guarding, no peritoneal signs BACK: The back appears normal EXT: Normal ROM in all joints; no deformity noted, no edema; no cyanosis, no calf tenderness or calf swelling SKIN: Normal color for age and race; warm; no rash on exposed skin NEURO: Moves all extremities equally, normal speech PSYCH: The patient's mood and manner are appropriate.   ED Results / Procedures / Treatments   LABS: (all labs ordered are listed, but only abnormal results are displayed) Labs Reviewed  BASIC METABOLIC PANEL - Abnormal; Notable for the following  components:      Result Value   Calcium 8.7 (*)    All other components within normal limits  CBC - Abnormal; Notable for the following components:   RDW 11.1 (*)    All other components within normal limits  TROPONIN I (HIGH SENSITIVITY)  TROPONIN I (HIGH SENSITIVITY)     EKG:  EKG Interpretation  Date/Time:  Monday May 25 2022 21:24:33 EDT Ventricular Rate:  77 PR Interval:  172 QRS Duration: 100 QT Interval:  370 QTC Calculation: 418 R Axis:   37 Text Interpretation: Normal sinus rhythm with sinus arrhythmia Normal ECG When compared with ECG of 29-Apr-2020 11:56, No significant change was found Confirmed by Rochele Raring [phone]) on 05/26/2022 4:49:19 AM          RADIOLOGY: My personal review and interpretation of imaging: Chest x-ray clear.  I have personally reviewed all radiology reports.   DG Chest 2 View  Result Date: 05/25/2022 CLINICAL DATA:  Chest pain EXAM: CHEST - 2 VIEW COMPARISON:  04/29/2020 FINDINGS: Lungs are well expanded, symmetric, and clear. No pneumothorax or pleural effusion. Cardiac size within normal limits. Pulmonary vascularity is normal. Osseous structures are age-appropriate. No acute bone abnormality. IMPRESSION: No active cardiopulmonary disease. Electronically Signed   By: Helyn Numbers M.D.   On: 05/25/2022 22:12     PROCEDURES:  Critical Care performed: No    Procedures    IMPRESSION / MDM / ASSESSMENT AND PLAN / ED COURSE  I reviewed the triage vital signs and the nursing notes.    Patient here for atypical chest pain ongoing for years.    DIFFERENTIAL DIAGNOSIS (includes but not limited to):   Musculoskeletal pain, pain related to smoking, doubt ACS, PE, dissection, pneumonia, pneumothorax, CHF   Patient's presentation is most consistent with acute presentation with potential threat to life or bodily function.   PLAN: Labs obtained from triage.  Normal hemoglobin.  No leukocytosis.  Normal electrolytes and renal function.  Troponin x2 negative.  EKG nonischemic without arrhythmia.  Chest x-ray reviewed and interpreted by myself and the radiologist and shows no acute abnormality.  Hemodynamically stable here.  I feel he is safe for discharge home for outpatient management for his chronic chest pain.  Recommended Tylenol, ibuprofen over-the-counter.  Patient PERC negative.   MEDICATIONS GIVEN IN ED: Medications  ibuprofen (ADVIL) tablet 800 mg (has no administration in time range)     ED COURSE:  At this time, I do not feel there is any life-threatening condition present. I reviewed all nursing notes, vitals, pertinent previous records.  All lab and urine results, EKGs, imaging ordered have  been independently reviewed and interpreted by myself.  I reviewed all available radiology reports from any imaging ordered this visit.  Based on my assessment, I feel the patient is safe to be discharged home without further emergent workup and can continue workup as an outpatient as needed. Discussed all findings, treatment plan as well as usual and customary return precautions.  They verbalize understanding and are comfortable with this plan.  Outpatient follow-up has been provided as needed.  All questions have been answered.    CONSULTS: No admission needed at this time for low risk patient with atypical chest pain ongoing for years with reassuring work-up in the ED.   OUTSIDE RECORDS REVIEWED:  none       FINAL CLINICAL IMPRESSION(S) / ED DIAGNOSES   Final diagnoses:  Chronic chest pain     Rx / DC Orders  ED Discharge Orders     None        Note:  This document was prepared using Dragon voice recognition software and may include unintentional dictation errors.   Sabrea Sankey, Layla Maw, DO 05/26/22 [phone]

## 2022-08-20 IMAGING — CR DG CHEST 2V
2 series · 2 of 2 positions shown · non-contrast
Comparison: 10/02/2017

CLINICAL DATA: Chest pain

EXAM:
CHEST - 2 VIEW

[chest pa]
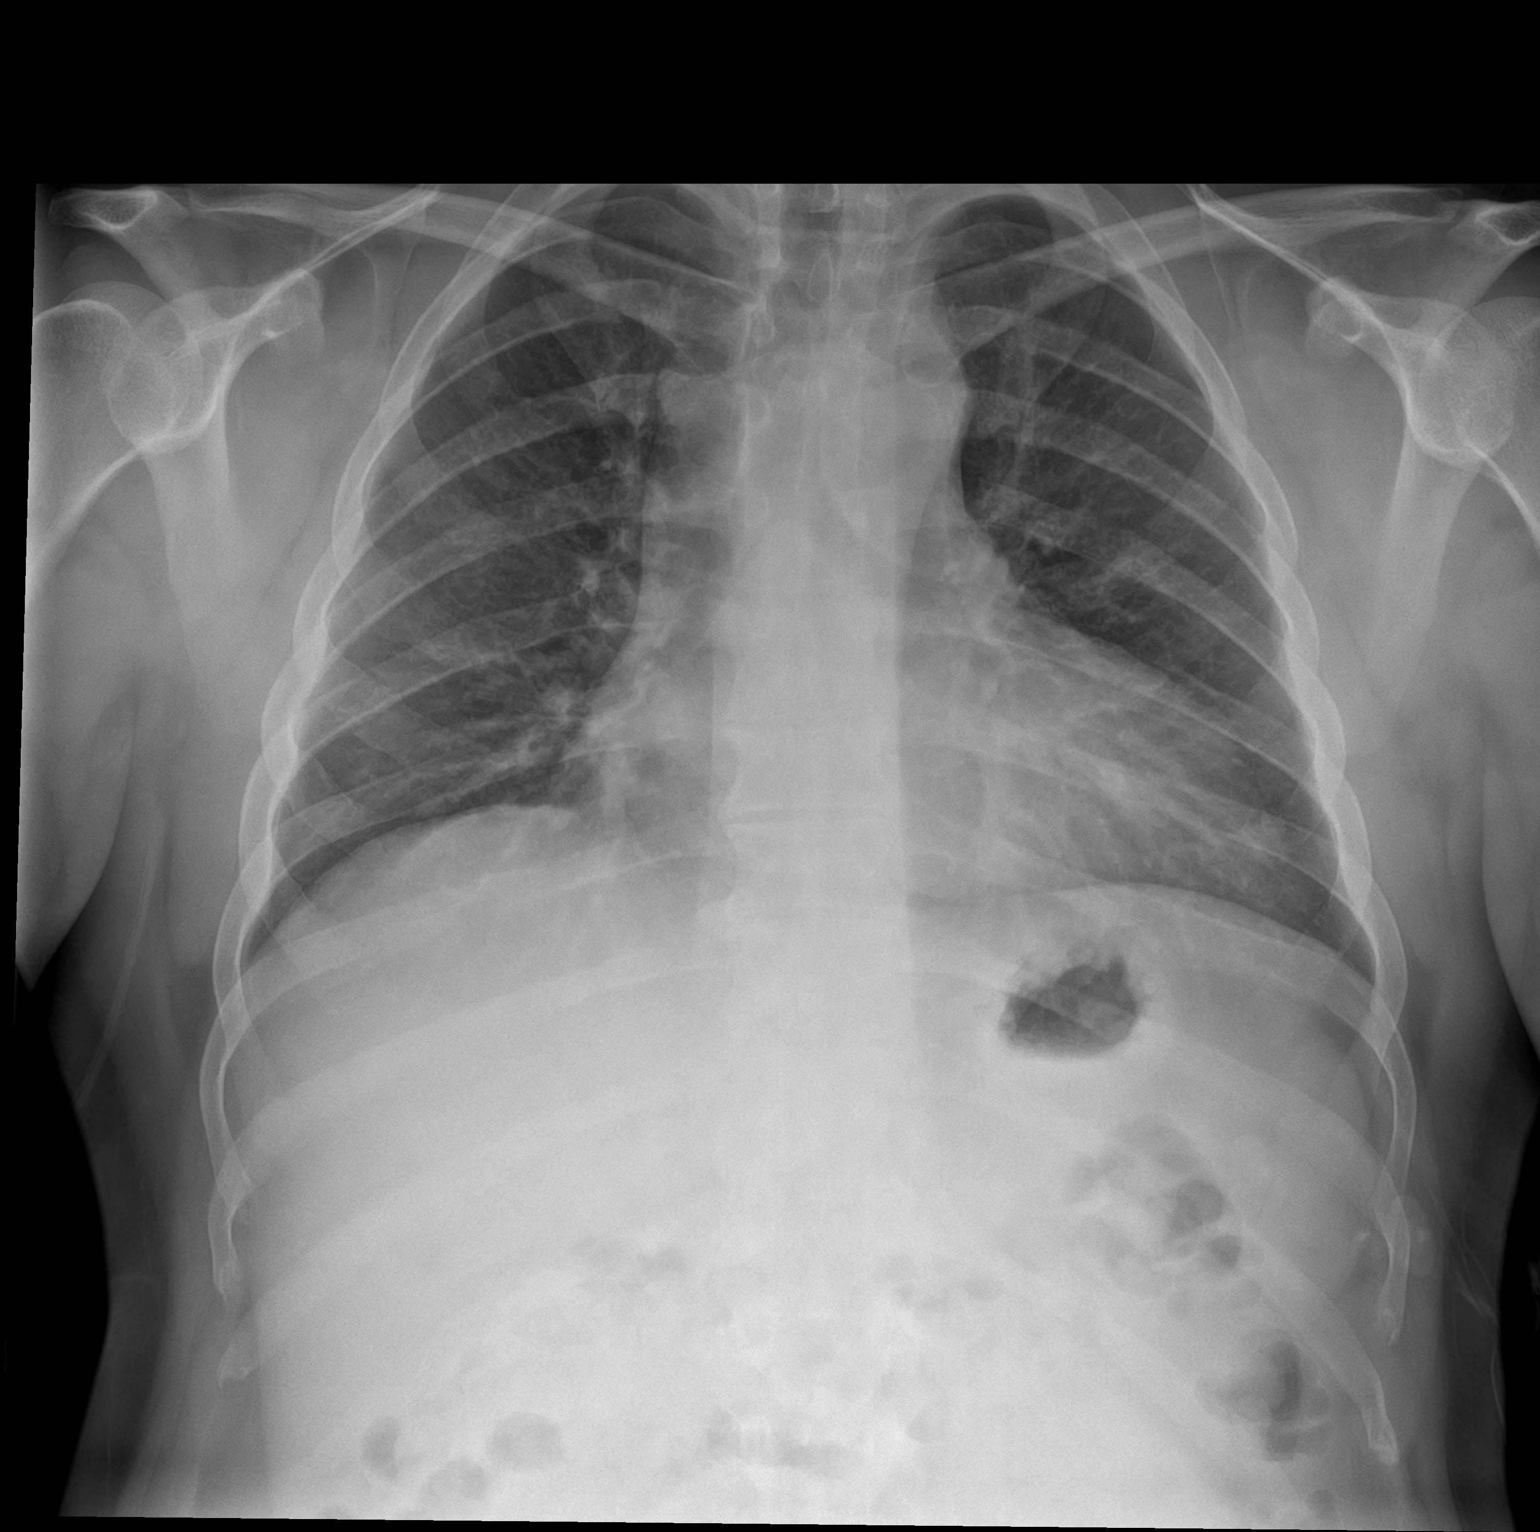

[chest lat]
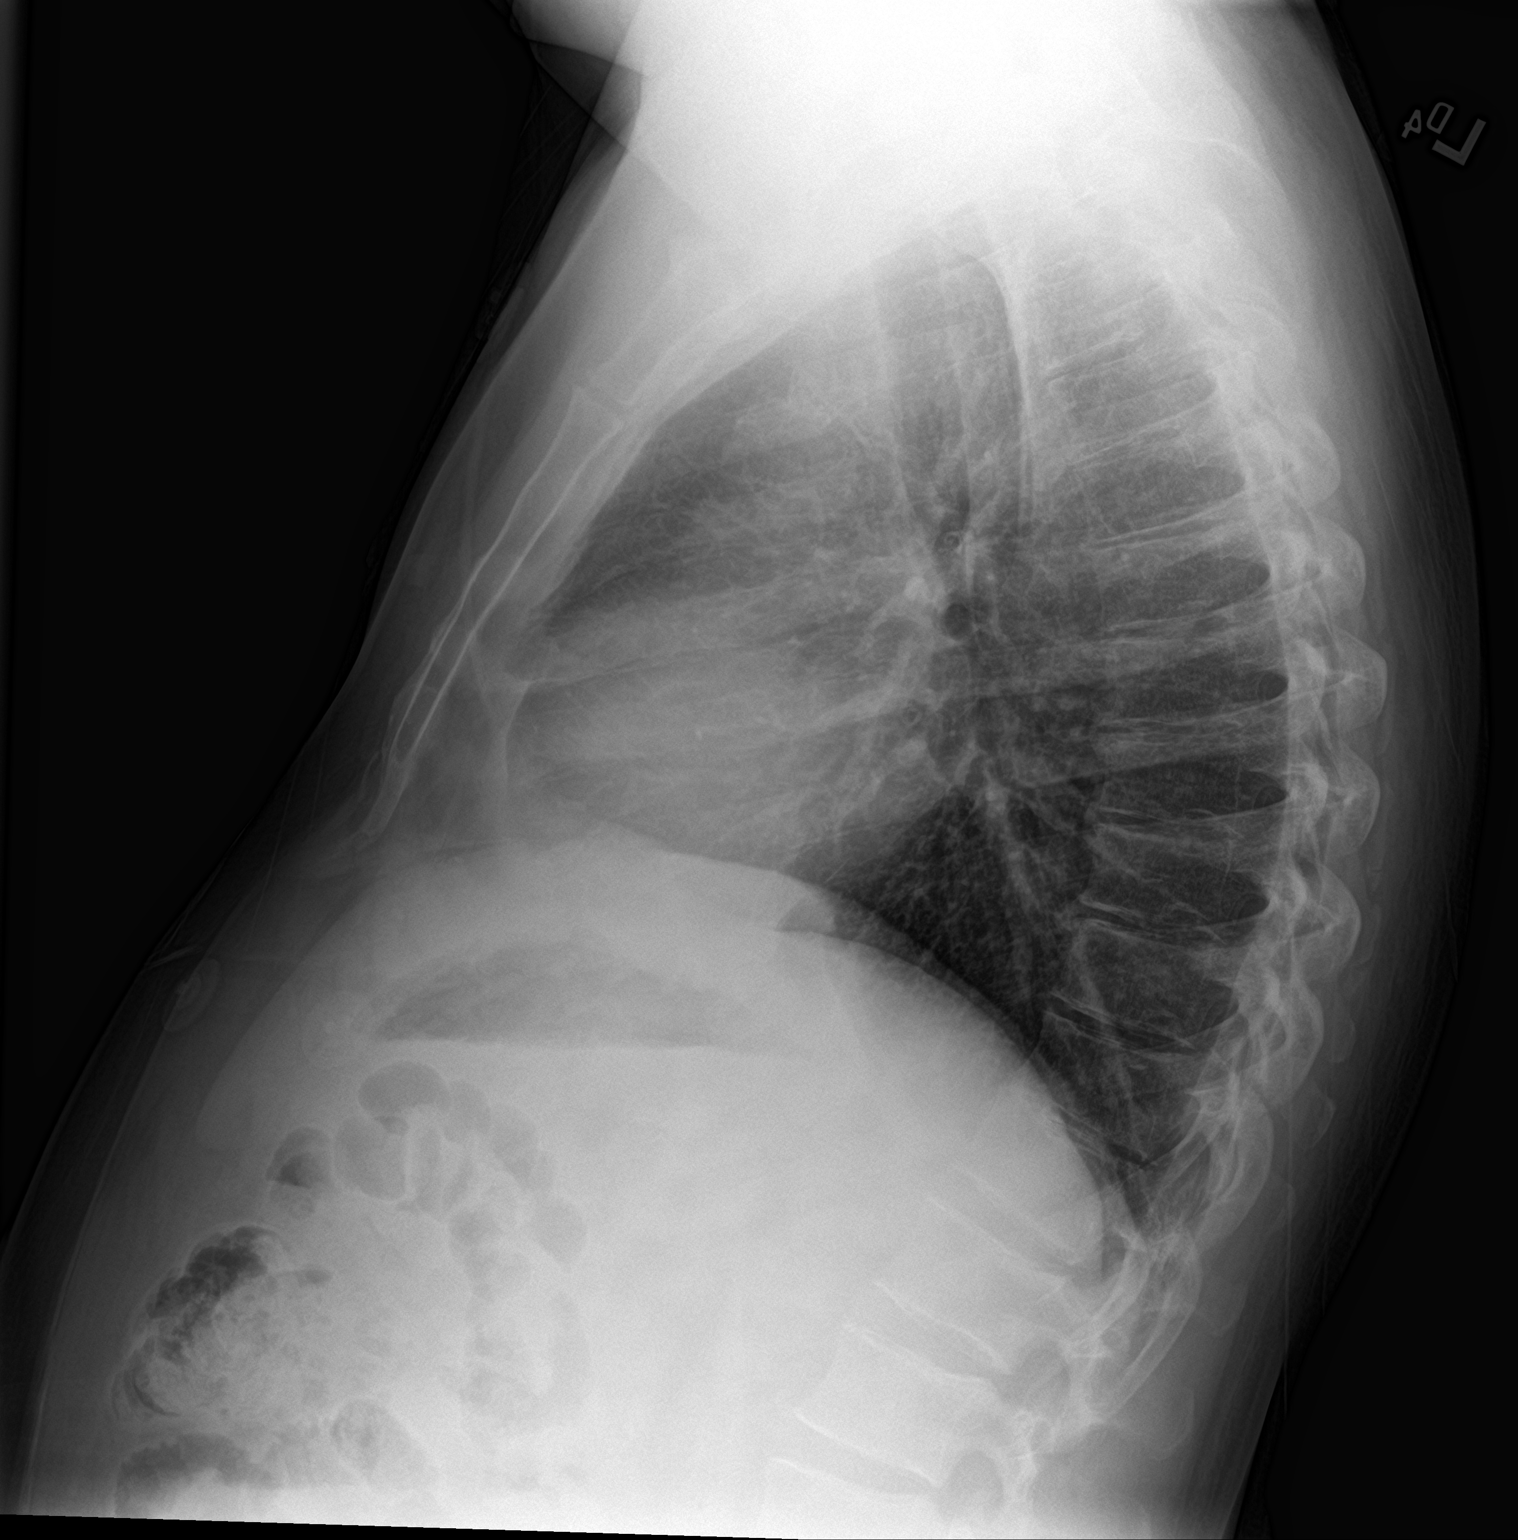

[2 of 2 positions shown; findings below may reference images not displayed]

FINDINGS: Heart size upper normal. Negative for heart failure. Lungs are well
aerated and clear without infiltrate or effusion. No acute skeletal
abnormality.
IMPRESSION: No active cardiopulmonary disease.
